# Patient Record
Sex: Male | Born: 1970 | Hispanic: Yes | Marital: Married | Smoking: Current some day smoker
Health system: Southern US, Community
[De-identification: ages and names within clinical notes are randomized; demographics above are authoritative.]

## PROBLEM LIST (undated history)

## (undated) DIAGNOSIS — E119 Type 2 diabetes mellitus without complications: Secondary | ICD-10-CM

## (undated) DIAGNOSIS — M1990 Unspecified osteoarthritis, unspecified site: Secondary | ICD-10-CM

## (undated) DIAGNOSIS — Z87898 Personal history of other specified conditions: Secondary | ICD-10-CM

## (undated) DIAGNOSIS — E66813 Obesity, class 3: Secondary | ICD-10-CM

## (undated) DIAGNOSIS — Z6841 Body Mass Index (BMI) 40.0 and over, adult: Secondary | ICD-10-CM

## (undated) DIAGNOSIS — G4730 Sleep apnea, unspecified: Secondary | ICD-10-CM

## (undated) DIAGNOSIS — I10 Essential (primary) hypertension: Secondary | ICD-10-CM

## (undated) HISTORY — PX: ANKLE SURGERY: SHX546

## (undated) HISTORY — DX: Personal history of other specified conditions: Z87.898

## (undated) HISTORY — DX: Essential (primary) hypertension: I10

## (undated) HISTORY — PX: HERNIA REPAIR: SHX51

---

## 2004-04-21 ENCOUNTER — Emergency Department: Payer: Self-pay | Admitting: Emergency Medicine

## 2004-05-04 ENCOUNTER — Emergency Department: Payer: Self-pay | Admitting: Emergency Medicine

## 2007-02-05 ENCOUNTER — Ambulatory Visit: Payer: Self-pay | Admitting: Podiatry

## 2007-04-17 ENCOUNTER — Encounter: Payer: Self-pay | Admitting: Unknown Physician Specialty

## 2007-05-07 ENCOUNTER — Encounter: Payer: Self-pay | Admitting: Unknown Physician Specialty

## 2007-06-06 ENCOUNTER — Encounter: Payer: Self-pay | Admitting: Unknown Physician Specialty

## 2007-07-07 ENCOUNTER — Encounter: Payer: Self-pay | Admitting: Unknown Physician Specialty

## 2007-08-06 ENCOUNTER — Encounter: Payer: Self-pay | Admitting: Unknown Physician Specialty

## 2007-11-27 ENCOUNTER — Ambulatory Visit: Payer: Self-pay | Admitting: Surgery

## 2007-12-04 ENCOUNTER — Ambulatory Visit: Payer: Self-pay | Admitting: Surgery

## 2010-11-27 ENCOUNTER — Ambulatory Visit: Payer: Self-pay | Admitting: Family Medicine

## 2010-11-27 IMAGING — CR DG KNEE COMPLETE 4+V*R*
1 series · 4 of 4 positions shown · non-contrast
Comparison: none

REASON FOR EXAM: rt knee pain  fax results to [PHONE_NUMBER]
COMMENTS:

PROCEDURE:     MDR - MDR KNEE RT COMPLETE W/OBLIQUES  - [DATE] [DATE]
RESULT:     Images of the right knee demonstrate no definite fracture,
dislocation or radiopaque foreign body.

[Series 1: view not recorded · 0.17mm/px · 4 of 4 slices shown]
[im 1/4]
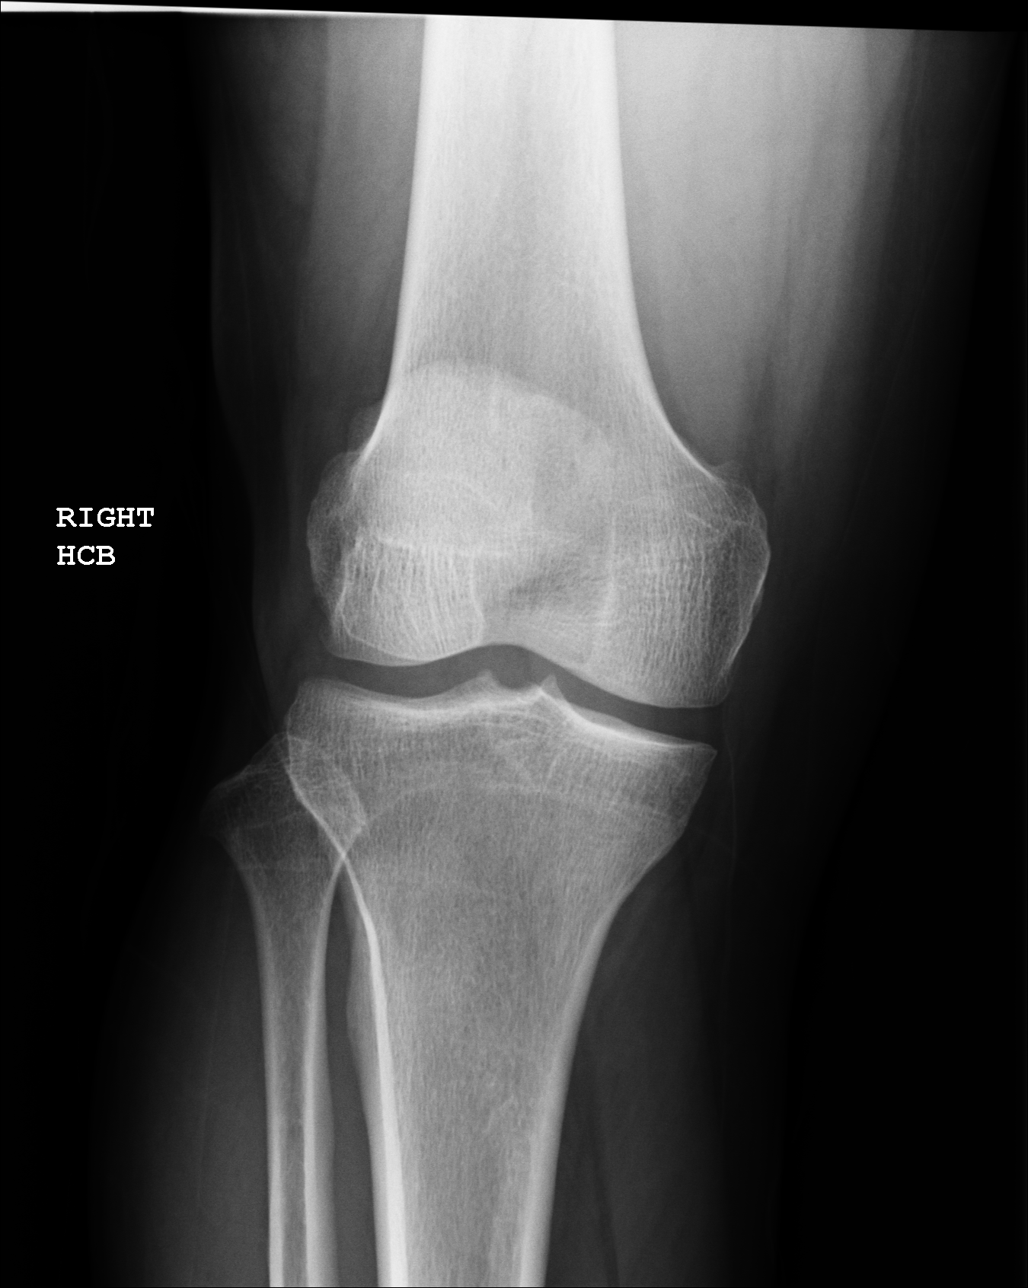
[im 2/4]
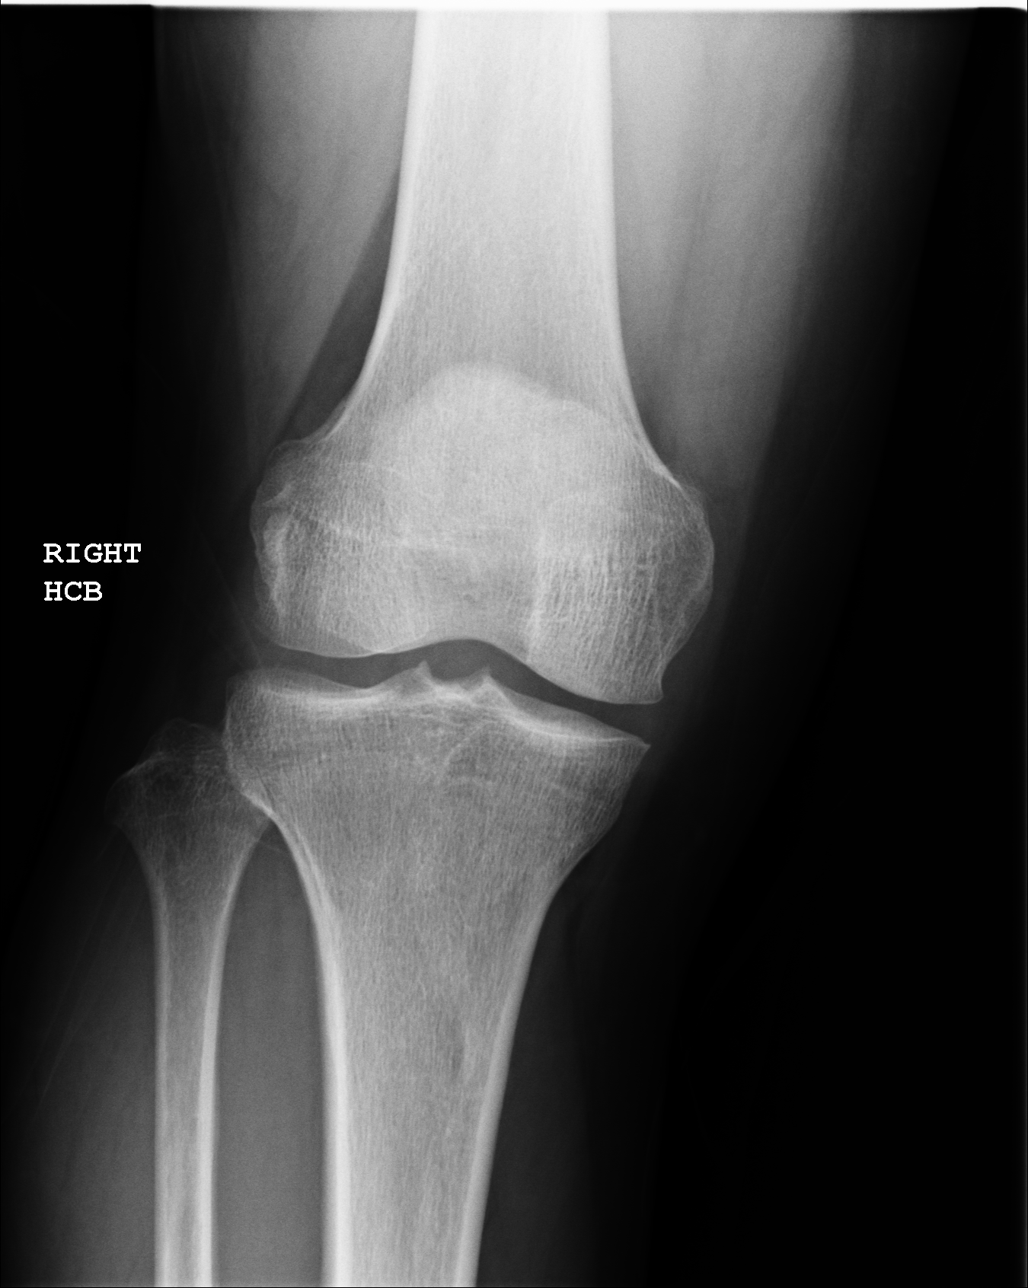
[im 3/4]
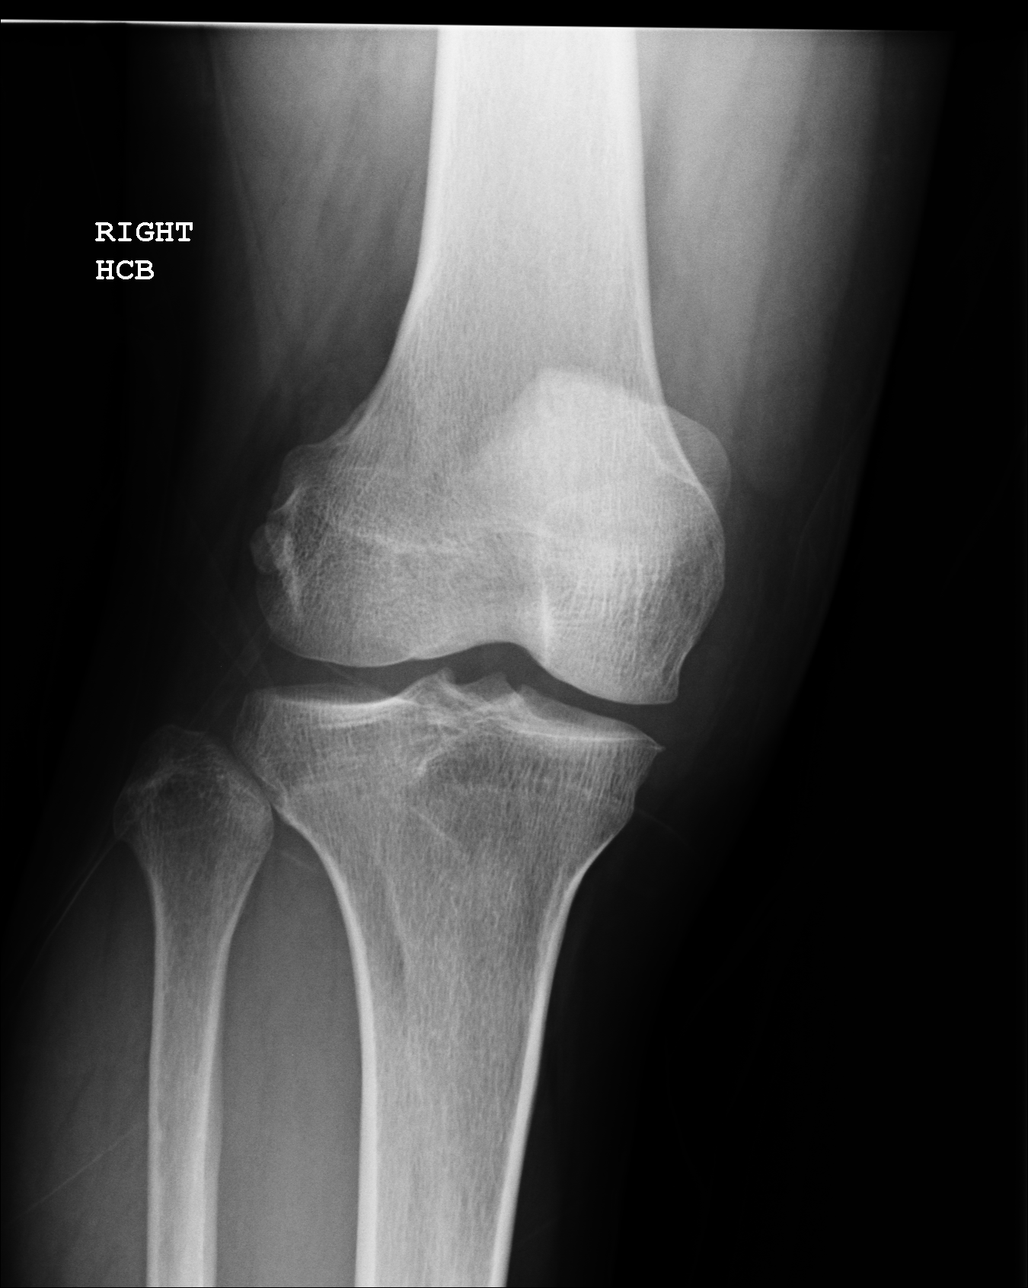
[im 4/4]
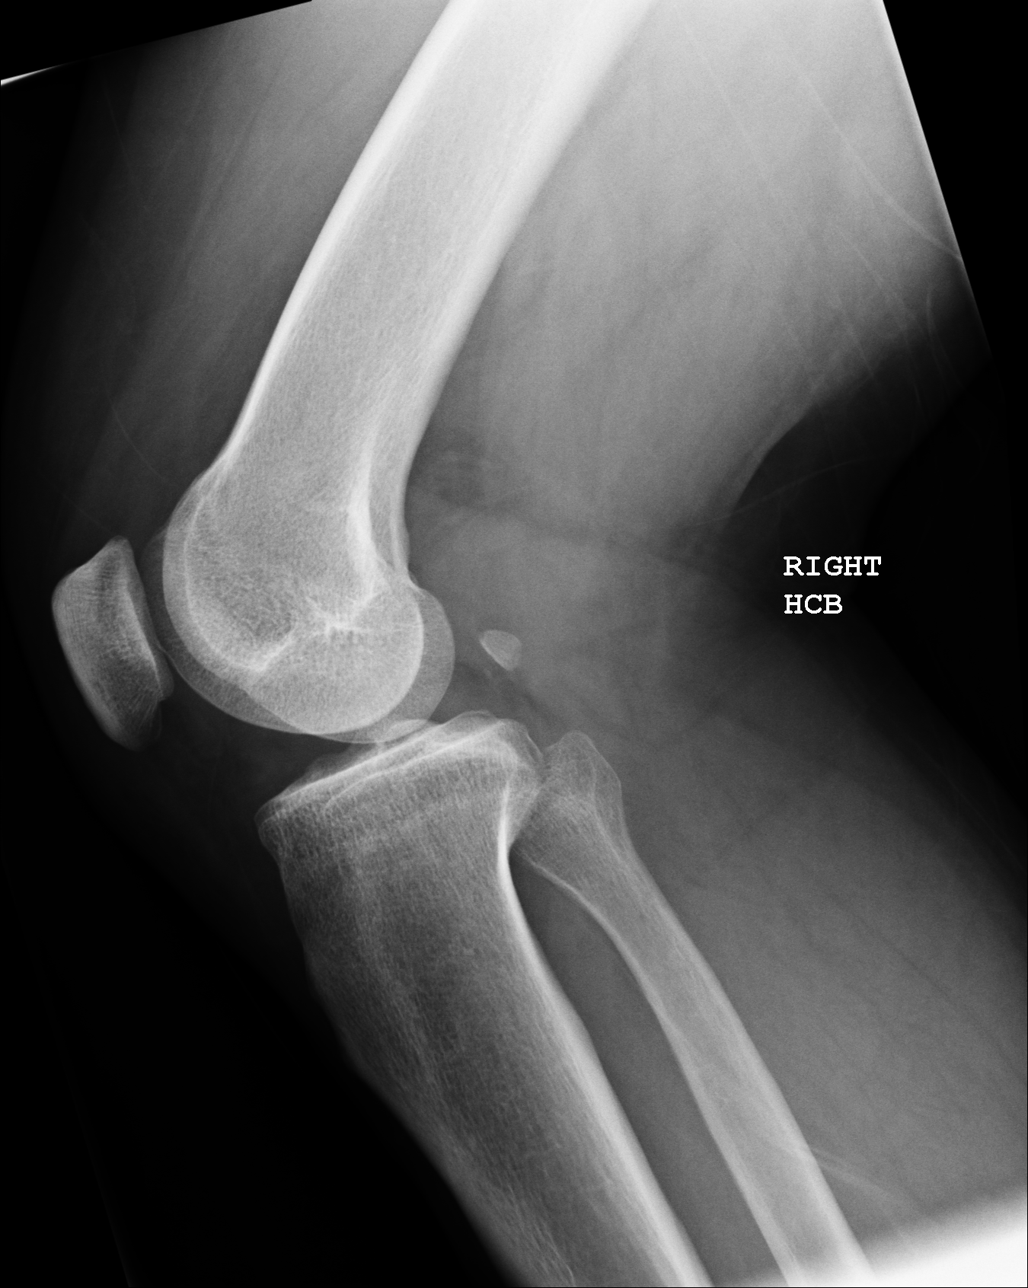

[4 of 4 positions shown; findings below may reference images not displayed]

IMPRESSION: Please see above.

## 2010-12-18 ENCOUNTER — Ambulatory Visit: Payer: Self-pay

## 2011-07-25 ENCOUNTER — Ambulatory Visit: Payer: Self-pay | Admitting: Anesthesiology

## 2011-07-25 LAB — BASIC METABOLIC PANEL
Anion Gap: 7 (ref 7–16)
Calcium, Total: 8.4 mg/dL — ABNORMAL LOW (ref 8.5–10.1)
Chloride: 107 mmol/L (ref 98–107)
Co2: 26 mmol/L (ref 21–32)
Creatinine: 0.63 mg/dL (ref 0.60–1.30)
EGFR (Non-African Amer.): >60
Glucose: 103 mg/dL — ABNORMAL HIGH (ref 65–99)
Osmolality: 279 (ref 275–301)
Potassium: 4 mmol/L (ref 3.5–5.1)

## 2011-07-27 ENCOUNTER — Ambulatory Visit: Payer: Self-pay | Admitting: Orthopedic Surgery

## 2014-03-24 DIAGNOSIS — E785 Hyperlipidemia, unspecified: Secondary | ICD-10-CM | POA: Insufficient documentation

## 2014-03-24 DIAGNOSIS — R7989 Other specified abnormal findings of blood chemistry: Secondary | ICD-10-CM | POA: Insufficient documentation

## 2014-05-30 NOTE — Op Note (Signed)
PATIENT NAME:  Carl Thornton, [name] MR#:  [id number] DATE OF BIRTH:  1970/07/16  DATE OF PROCEDURE:  07/27/2011  PREOPERATIVE DIAGNOSIS: Right knee medial meniscus tear.   POSTOPERATIVE DIAGNOSIS: Right knee medial meniscus tear.   PROCEDURE: Right knee partial medial meniscectomy.   SURGEON: Leitha SchullerMichael J. Won Kreuzer, MD  ANESTHESIA: General.    DESCRIPTION OF PROCEDURE: Patient was brought to the Operating Room and after adequate anesthesia was obtained, the leg was placed in the arthroscopic legholder with the leg prepped and draped in the usual sterile fashion. After patient identification and timeout procedures were completed an inferolateral portal was made and the arthroscope introduced. Initial inspection revealed essentially normal appearing patellofemoral joint. After coming to the medial compartment, an inferomedial portal was made and probe introduced. The articular cartilage showed some areas of fibrillation on both femoral and tibial condyles, approximately 25% of the cartilage. On probing there was an extensive complex tear at the junction of the middle and posterior thirds extending more into the middle third with involvement out to the outer area of the meniscus with both vertical and horizontal components that extended more posteriorly as well. The anterior cruciate ligament was intact and lateral compartment was normal. A meniscal punch and shaver were then used to debride most of the meniscal tear and an ArthroCare wand was then used to smooth off the residual. The knee was thoroughly irrigated until clear. The wound was infiltrated with 20 mL of 0.5% Sensorcaine with epinephrine for postoperative analgesia and covered with Xeroform, 4 x 4's, Webril, and Ace wrap.   ESTIMATED BLOOD LOSS: Minimal.   COMPLICATIONS: None.      SPECIMENS: None. Pre- and postprocedure pictures obtained.   ____________________________ Leitha SchullerMichael J. Taheem Fricke, MD mjm:cms D: 07/27/2011 22:50:19  ET T: 07/28/2011 10:30:35 ET JOB#: [id number]  cc: Leitha SchullerMichael J. Cochise Dinneen, MD, <Dictator> Leitha SchullerMICHAEL J Shantella Blubaugh MD ELECTRONICALLY SIGNED 07/29/2011 7:53

## 2016-09-19 ENCOUNTER — Encounter: Payer: Self-pay | Admitting: Nurse Practitioner

## 2016-09-19 ENCOUNTER — Ambulatory Visit (INDEPENDENT_AMBULATORY_CARE_PROVIDER_SITE_OTHER): Payer: Self-pay | Admitting: Nurse Practitioner

## 2016-09-19 ENCOUNTER — Other Ambulatory Visit: Payer: Self-pay

## 2016-09-19 VITALS — BP 107/55 | HR 70 | Temp 98.3°F | Ht 65.5 in | Wt 266.2 lb

## 2016-09-19 DIAGNOSIS — R7303 Prediabetes: Secondary | ICD-10-CM

## 2016-09-19 DIAGNOSIS — N521 Erectile dysfunction due to diseases classified elsewhere: Secondary | ICD-10-CM | POA: Diagnosis not present

## 2016-09-19 DIAGNOSIS — G629 Polyneuropathy, unspecified: Secondary | ICD-10-CM | POA: Diagnosis not present

## 2016-09-19 DIAGNOSIS — G4730 Sleep apnea, unspecified: Secondary | ICD-10-CM | POA: Insufficient documentation

## 2016-09-19 DIAGNOSIS — Z7689 Persons encountering health services in other specified circumstances: Secondary | ICD-10-CM

## 2016-09-19 DIAGNOSIS — R0683 Snoring: Secondary | ICD-10-CM | POA: Diagnosis not present

## 2016-09-19 DIAGNOSIS — R400 Somnolence: Secondary | ICD-10-CM | POA: Diagnosis not present

## 2016-09-19 LAB — POCT GLYCOSYLATED HEMOGLOBIN (HGB A1C): Hemoglobin A1C: 6.8

## 2016-09-19 MED ORDER — GABAPENTIN 100 MG PO CAPS: 200.0000 mg | ORAL_CAPSULE | Freq: Every day | ORAL | 0 refills | Status: DC

## 2016-09-19 NOTE — Assessment & Plan Note (Signed)
Pt w/ decreased libido and decreased erectile function.  Likely related to undiagnosed sleep apnea, morbid obesity, and diabetes.  Plan: 1. Work on lifestyle modification to improve other chronic conditions. 2. Discussed sildenafil as option for symptomatic treatment in future.  Defer at this time. 3. Follow up 3 months.

## 2016-09-19 NOTE — Assessment & Plan Note (Signed)
Pt w/ acute onset neuropathy w/ tingling and numbness of right hand only.  Pt w/o decreased ROM. Low suspicion for diabetic neuropathy r/t single extremity affected.  Possible cervical radiculopathy, but negative for radiating pain.  Likely radial and ulnar nerve impingement at level of wrist complicated by repetitive work activities.  Plan:  1. Treat with NSAIDs (acetaminophen and ibuprofen).  Discussed alternate dosing and max dosing. 2. Apply heat and/or ice to affected area. 3. May also apply a muscle rub with lidocaine after heat or ice. 4. Take gabapentin 100-200 mg at bedtime.  Cautioned drowsiness. 5. Recommend workplace safety evaluation.  Encouraged modification of wrist movements, resting hand when not at work. 6. Follow up as needed if no improvement.

## 2016-09-19 NOTE — Assessment & Plan Note (Signed)
Pt w/ morbid obesity, neck circumference 20.25 inches, STOP-BANG score: 6 which places pt at high risk for OSA.  He had been recommended to have sleep study by former PCP and has not had it completed.  Plan: 1. Sleep study order placed.  Discussed risks of untreated OSA. 2. Follow up as needed after test.

## 2016-09-19 NOTE — Patient Instructions (Addendum)
Carl Thornton, Thank you for coming in to clinic today.  1. For your tingling and numbness (neuropathy): - take gabapentin 100 mg once daily at bedtime.  Increase to two tablets at bedtime if no relief.  2. For your snoring: - I have requested a sleep center sleep study.  They will call you for this appointment.  3. For your weight: - Start with a food log to establish your baseline of what you are currently eating. - Cut back your serving sizes to eat less. - Weight loss goal is 1/2 to 1 lb per week.  Short term goal: 1 year = 25 lb weight loss. - Online resources: WrestlingReporter.dk and LimitLaws.com.cy (food logging and calorie counting)  Please schedule a follow-up appointment with [name], AGNP. Return in about 2 weeks (around 10/03/2016) for annual physical; in 3 months for diabetes; and as needed for your numbness and tingling.  If you have any other questions or concerns, please feel free to call the clinic or send a message through MyChart. You may also schedule an earlier appointment if necessary.  You will receive a survey after today's visit either digitally by e-mail or paper by [employer]. Your experiences and feedback matter to [place].  Please respond so we know how we are doing as we provide care for you.   [name], DNP, AGNP-BC Adult Gerontology Nurse Practitioner [facility], [facility]    Serving Sizes A serving size is a measured amount of food or drink, such as one slice of bread, that has an associated nutrient content. Knowing the serving size of a food or drink can help you determine how much of that food you should consume. What is the size of one serving? The size of one healthy serving depends on the food or drink. To determine a serving size, read the food label. If the food or drink does not have a food label, try to find serving size information online. Or, use the following to estimate the size of one adult serving: Grain 1 slice bread.  bagel.   cup pasta. Vegetable  cup cooked or canned vegetables. 1 cup raw, leafy greens. Fruit  cup canned fruit. 1 medium fruit.  cup dried fruit. Meat and Other Protein Sources 1 oz meat, poultry, or fish.  cup cooked beans. 1 egg.  cup nuts or seeds. 1 Tbsp nut butter.  cup tofu or tempeh. 2 Tbsp hummus. Dairy An individual container of yogurt (6-8 oz). 1 piece of cheese the size of your thumb (1 oz). 1 cup (8 oz) milk or milk alternative. Fat A piece the size of one dice. 1 tsp soft margarine. 1 Tbsp mayonnaise. 1 tsp vegetable oil. 1 Tbsp regular salad dressing. 2 Tbsp low-fat salad dressing. How many servings should I eat from each food group each day? The following are the suggested number of servings to try and have every day from each food group. You can also look at your eating throughout the week and aim for meeting these requirements on most days for overall healthy eating. Grain 6-8 servings. Try to have half of your grains from whole grains, such as whole wheat bread, corn tortillas, oatmeal, brown rice, whole wheat pasta, and bulgur. Vegetable At least 2-3 servings. Fruit 2 servings. Meat and Other Protein Foods 5-6 servings. Aim to have lean proteins, such as chicken, [place], fish, beans, or tofu. Dairy 3 servings. Choose low-fat or nonfat if you are trying to control your weight. Fat 2-3 servings. Is  a serving the same thing as a portion? No. A portion is the actual amount you eat, which may be more than one serving. Knowing the specific serving size of a food and the nutritional information that goes with it can help you make a healthy decision on what size portion to eat. What are some tips to help me learn healthy serving sizes?  Check food labels for serving sizes. Many foods that come as a single portion actually contain multiple servings.  Determine the serving size of foods you commonly eat and figure out how large a portion you usually eat.  Measure the  number of servings that can be held by the bowls, glasses, cups, and plates you typically use. For example, pour your breakfast cereal into your regular bowl and then pour it into a measuring cup.  For 1-2 days, measure the serving sizes of all the foods you eat.  Practice estimating serving sizes and determining how big your portions should be. This information is not intended to replace advice given to you by your health care provider. Make sure you discuss any questions you have with your health care provider. Document Released: 10/21/2002 Document Revised: 09/17/2015 Document Reviewed: 04/21/2013 Elsevier Interactive Patient Education  [employer]2018 Elsevier Inc.

## 2016-09-19 NOTE — Assessment & Plan Note (Signed)
Pt previously tested and deemed pre-diabetic.  Poor adherence to lifestyle changes.  No recent evaluation.  Plan: 1. POCT A1c today indicates progression to diabetes. 2. Reviewed lifestyle changes.   3. Discussed starting metformin.  Will hold medication until 3 months to confirm diabetes diagnosis.   4. Follow up 3 months.

## 2016-09-19 NOTE — Progress Notes (Signed)
I have reviewed this encounter including the documentation in this note and/or discussed this patient with the provider, Lauren Kennedy, AGPCNP-BC. I am certifying that I agree with the content of this note as supervising physician.  Devario Bucklew, DO [facility] 09/19/2016, 1:26 PM 

## 2016-09-19 NOTE — Progress Notes (Signed)
Subjective:    Patient ID: Carl Thornton, male    DOB: 04/01/1970, 46 y.o.   MRN: [id number]  Talmadge Jackelyn Poling Derrell Lolling is a 46 y.o. male presenting on 09/19/2016 for Establish Care (numbness,tingling in the fingers on right hand x 2 weeks )   HPI Establish Care New Provider Pt last seen by PCP 2 years ago.  Obtain records from [facility].   Neuropathy Fingers are numb and tingling.  Some small amount of pain before the numbness.  Started 2 weeks ago.  Feels when he goes to bed and is constant throughout the day. Chopping daily, right hand dominant. - Denies neck, shoulder, arm pain.  Prior wrist pain, but no current pain. Has not taken any medications for current symptoms.  - History of prediabetes.  Has added exercise and improved eating in past, but hasn't continued these lifestyle changes.  Snoring Falls asleep when he is still and quiet.   Observed snoring w/ apneas at night.   Daytime sleepiness.   Occasionally feels he is choking when he wakes up - only one known occurrence.   Denies frequent headaches.  STOP-Bang OSA scoring Snoring yes 1  Tiredness yes 1  Observed apneas yes 1  Pressure HTN no 0  BMI > 35 kg/m2 yes 1  Age > 50  no 0  Neck (male >17 in = 20.25 inches yes 1   Gender male yes 1  OSA risk low (0-2)  OSA risk intermediate (3-4)  OSA risk high (5+)  Total: 6    Erectile Dysfunction   Pt notes is having more difficulty obtaining an erection and decreased libido.  Notes is not impossible to obtain erection, but significantly more difficult than in past.  Would like to be sexually active more frequently. Asks about possible causes of this at age 57 and states he shouldn't be having this problem "at my age."  Past Medical History:  Diagnosis Date  . History of prediabetes   . Hypertension    not on medication   Past Surgical History:  Procedure Laterality Date  . ANKLE SURGERY    . HERNIA REPAIR     umbilical    Social History   Social History  . Marital status: Married    Spouse name: N/A  . Number of children: N/A  . Years of education: N/A   Occupational History  . Not on file.   Social History Main Topics  . Smoking status: Current Some Day Smoker    Types: Cigarettes  . Smokeless tobacco: Never Used     Comment: smokes 1 pack per week  . Alcohol use Yes     Comment: 12+ beverages   . Drug use: No  . Sexual activity: Yes   Other Topics Concern  . Not on file   Social History Narrative  . No narrative on file   Family History  Problem Relation Age of Onset  . Diabetes Mother   . Asthma Father   . Diabetes Sister   . Heart attack Neg Hx   . Stroke Neg Hx   . Cancer Neg Hx    No current outpatient prescriptions on file prior to visit.   No current facility-administered medications on file prior to visit.     Review of Systems  Constitutional: Positive for fatigue.  HENT: Negative.   Eyes: Negative.   Respiratory: Negative.   Cardiovascular: Negative.   Gastrointestinal: Negative.   Endocrine: Negative.   Genitourinary:  Decreased libido, difficulty obtaining erection  Skin: Positive for color change.       Toenail discoloration  Allergic/Immunologic: Negative.   Neurological: Positive for numbness.  Hematological: Negative.   Psychiatric/Behavioral: Negative.    Per HPI unless specifically indicated above      Objective:    BP (!) 107/55 (BP Location: Right Arm, Patient Position: Sitting, Cuff Size: Large)   Pulse 70   Temp 98.3 F (36.8 C) (Oral)   Ht 5' 5.5" (1.664 m)   Wt 266 lb 3.2 oz (120.7 kg)   BMI 43.62 kg/m   Wt Readings from Last 3 Encounters:  09/19/16 266 lb 3.2 oz (120.7 kg)     Physical Exam  General - morbidly obese, well-appearing, NAD HEENT - Normocephalic, atraumatic Neck - supple, non-tender, no LAD, no thyromegaly, no carotid bruit Heart - RRR, no murmurs heard Lungs - Clear throughout all lobes, no wheezing,  crackles, or rhonchi. Normal work of breathing. Extremeties - non-tender, no edema, cap refill < 2 seconds, peripheral pulses intact +2 bilaterally Musculoskeletal - Full AROM neck, shoulders, elbows, wrists hands bilaterally, negative Phalen and Tinel tests. Skin - warm, dry Neuro - awake, alert, oriented x3, normal gait Psych - Normal mood and affect, normal behavior    Results for orders placed or performed in visit on 09/19/16  POCT HgB A1C  Result Value Ref Range   Hemoglobin A1C 6.8       Assessment & Plan:   Problem List Items Addressed This Visit      Respiratory   Observed sleep apnea    Pt w/ morbid obesity, neck circumference 20.25 inches, STOP-BANG score: 6 which places pt at high risk for OSA.  He had been recommended to have sleep study by former PCP and has not had it completed.  Plan: 1. Sleep study order placed.  Discussed risks of untreated OSA. 2. Follow up as needed after test.      Relevant Orders   Nocturnal polysomnography (NPSG)     Nervous and Auditory   Neuropathy    Pt w/ acute onset neuropathy w/ tingling and numbness of right hand only.  Pt w/o decreased ROM. Low suspicion for diabetic neuropathy r/t single extremity affected.  Possible cervical radiculopathy, but negative for radiating pain.  Likely radial and ulnar nerve impingement at level of wrist complicated by repetitive work activities.  Plan:  1. Treat with NSAIDs (acetaminophen and ibuprofen).  Discussed alternate dosing and max dosing. 2. Apply heat and/or ice to affected area. 3. May also apply a muscle rub with lidocaine after heat or ice. 4. Take gabapentin 100-200 mg at bedtime.  Cautioned drowsiness. 5. Recommend workplace safety evaluation.  Encouraged modification of wrist movements, resting hand when not at work. 6. Follow up as needed if no improvement.       Relevant Medications   gabapentin (NEURONTIN) 100 MG capsule   Other Relevant Orders   POCT HgB A1C (Completed)      Other   Prediabetes - Primary    Pt previously tested and deemed pre-diabetic.  Poor adherence to lifestyle changes.  No recent evaluation.  Plan: 1. POCT A1c today indicates progression to diabetes. 2. Reviewed lifestyle changes.   3. Discussed starting metformin.  Will hold medication until 3 months to confirm diabetes diagnosis.   4. Follow up 3 months.       Relevant Orders   POCT HgB A1C (Completed)   Erectile dysfunction due to diseases classified elsewhere  Pt w/ decreased libido and decreased erectile function.  Likely related to undiagnosed sleep apnea, morbid obesity, and diabetes.  Plan: 1. Work on lifestyle modification to improve other chronic conditions. 2. Discussed sildenafil as option for symptomatic treatment in future.  Defer at this time. 3. Follow up 3 months.       Other Visit Diagnoses    Daytime sleepiness       See A/P for Observed sleep apnea   Relevant Orders   Nocturnal polysomnography (NPSG)   Snoring       See A/P for Observed sleep apnea   Relevant Orders   Nocturnal polysomnography (NPSG)   Encounter to establish care     Previous PCP was at [facility].  Results reviewed in Care Everywhere.  Past medical, family, and surgical history reviewed.       Meds ordered this encounter  Medications  . DISCONTD: gabapentin (NEURONTIN) 100 MG capsule    Sig: Take 2 capsules (200 mg total) by mouth at bedtime.    Dispense:  60 capsule    Refill:  0    Order Specific Question:   Supervising Provider    Answer:   Smitty Cords [[id number]]  . gabapentin (NEURONTIN) 100 MG capsule    Sig: Take 2 capsules (200 mg total) by mouth at bedtime.    Dispense:  60 capsule    Refill:  0      Follow up plan: Return in about 2 weeks (around 10/03/2016) for annual physical; in 3 months for diabetes; and as needed for your numbness and tingling.  [name], DNP, AGPCNP-BC Adult Gerontology Primary Care Nurse Practitioner [facility] 09/19/2016, 12:50 PM

## 2016-10-03 ENCOUNTER — Ambulatory Visit (INDEPENDENT_AMBULATORY_CARE_PROVIDER_SITE_OTHER): Payer: Managed Care, Other (non HMO) | Admitting: Nurse Practitioner

## 2016-10-03 ENCOUNTER — Encounter: Payer: Self-pay | Admitting: Nurse Practitioner

## 2016-10-03 ENCOUNTER — Telehealth: Payer: Self-pay

## 2016-10-03 VITALS — BP 116/61 | HR 73 | Temp 97.7°F | Ht 65.5 in | Wt 262.4 lb

## 2016-10-03 DIAGNOSIS — E782 Mixed hyperlipidemia: Secondary | ICD-10-CM | POA: Diagnosis not present

## 2016-10-03 DIAGNOSIS — Z0000 Encounter for general adult medical examination without abnormal findings: Secondary | ICD-10-CM | POA: Diagnosis not present

## 2016-10-03 DIAGNOSIS — Z125 Encounter for screening for malignant neoplasm of prostate: Secondary | ICD-10-CM

## 2016-10-03 DIAGNOSIS — Z23 Encounter for immunization: Secondary | ICD-10-CM | POA: Diagnosis not present

## 2016-10-03 DIAGNOSIS — Z1211 Encounter for screening for malignant neoplasm of colon: Secondary | ICD-10-CM | POA: Diagnosis not present

## 2016-10-03 DIAGNOSIS — G629 Polyneuropathy, unspecified: Secondary | ICD-10-CM | POA: Diagnosis not present

## 2016-10-03 LAB — CBC WITH DIFFERENTIAL/PLATELET
Basophils Absolute: 46 cells/uL (ref 0–200)
Basophils Relative: 1 %
Eosinophils Absolute: 184 cells/uL (ref 15–500)
Eosinophils Relative: 4 %
HCT: 48 % (ref 38.5–50.0)
Hemoglobin: 16 g/dL (ref 13.2–17.1)
Lymphocytes Relative: 45 %
Lymphs Abs: 2070 cells/uL (ref 850–3900)
MCH: 30.5 pg (ref 27.0–33.0)
MCHC: 33.3 g/dL (ref 32.0–36.0)
MCV: 91.6 fL (ref 80.0–100.0)
MPV: 9.5 fL (ref 7.5–12.5)
Monocytes Absolute: 230 cells/uL (ref 200–950)
Monocytes Relative: 5 %
Neutro Abs: 2070 cells/uL (ref 1500–7800)
Neutrophils Relative %: 45 %
Platelets: 283 10*3/uL (ref 140–400)
RBC: 5.24 MIL/uL (ref 4.20–5.80)
RDW: 14.6 % (ref 11.0–15.0)
WBC: 4.6 10*3/uL (ref 3.8–10.8)

## 2016-10-03 MED ORDER — GABAPENTIN 300 MG PO CAPS: 300.0000 mg | ORAL_CAPSULE | Freq: Every day | ORAL | 5 refills | Status: DC

## 2016-10-03 NOTE — Telephone Encounter (Signed)
I called [facility] and left a detail message on their vm to find out the status of the sleep study referral.

## 2016-10-03 NOTE — Patient Instructions (Addendum)
Carl Thornton, Thank you for coming in to clinic today.  1. For your screenings: - labs today. - Colonoscopy: Gastroenterology will call you to make an appointment.     - TETANUS and FLU shots were given today.   Please schedule a follow-up appointment with [name], AGNP. Return in about 6 months (around 04/04/2017) for Prediabetes and in 1 year for annual physical.  If you have any other questions or concerns, please feel free to call the clinic or send a message through MyChart. You may also schedule an earlier appointment if necessary.  You will receive a survey after today's visit either digitally by e-mail or paper by [employer]. Your experiences and feedback matter to [place].  Please respond so we know how we are doing as we provide care for you.   [name], DNP, AGNP-BC Adult Gerontology Nurse Practitioner [facility], [facility]   Colonoscopy, Adult A colonoscopy is an exam to look at the entire large intestine. During the exam, a lubricated, bendable tube is inserted into the anus and then passed into the rectum, colon, and other parts of the large intestine. A colonoscopy is often done as a part of normal colorectal screening or in response to certain symptoms, such as anemia, persistent diarrhea, abdominal pain, and blood in the stool. The exam can help screen for and diagnose medical problems, including:  Tumors.  Polyps.  Inflammation.  Areas of bleeding.  Tell a health care provider about:  Any allergies you have.  All medicines you are taking, including vitamins, herbs, eye drops, creams, and over-the-counter medicines.  Any problems you or family members have had with anesthetic medicines.  Any blood disorders you have.  Any surgeries you have had.  Any medical conditions you have.  Any problems you have had passing stool. What are the risks? Generally, this is a safe procedure. However, problems may occur, including:  Bleeding.  A  tear in the intestine.  A reaction to medicines given during the exam.  Infection (rare).  What happens before the procedure? Eating and drinking restrictions Follow instructions from your health care provider about eating and drinking, which may include:  A few days before the procedure - follow a low-fiber diet. Avoid nuts, seeds, dried fruit, raw fruits, and vegetables.  1-3 days before the procedure - follow a clear liquid diet. Drink only clear liquids, such as clear broth or bouillon, black coffee or tea, clear juice, clear soft drinks or sports drinks, gelatin dessert, and popsicles. Avoid any liquids that contain red or purple dye.  On the day of the procedure - do not eat or drink anything during the 2 hours before the procedure, or within the time period that your health care provider recommends.  Bowel prep If you were prescribed an oral bowel prep to clean out your colon:  Take it as told by your health care provider. Starting the day before your procedure, you will need to drink a large amount of medicated liquid. The liquid will cause you to have multiple loose stools until your stool is almost clear or light green.  If your skin or anus gets irritated from diarrhea, you may use these to relieve the irritation: ? Medicated wipes, such as adult wet wipes with aloe and vitamin E. ? A skin soothing-product like petroleum jelly.  If you vomit while drinking the bowel prep, take a break for up to 60 minutes and then begin the bowel prep again. If vomiting continues and you  cannot take the bowel prep without vomiting, call your health care provider.  General instructions  Ask your health care provider about changing or stopping your regular medicines. This is especially important if you are taking diabetes medicines or blood thinners.  Plan to have someone take you home from the hospital or clinic. What happens during the procedure?  An IV tube may be inserted into one of  your veins.  You will be given medicine to help you relax (sedative).  To reduce your risk of infection: ? Your health care team will wash or sanitize their hands. ? Your anal area will be washed with soap.  You will be asked to lie on your side with your knees bent.  Your health care provider will lubricate a long, thin, flexible tube. The tube will have a camera and a light on the end.  The tube will be inserted into your anus.  The tube will be gently eased through your rectum and colon.  Air will be delivered into your colon to keep it open. You may feel some pressure or cramping.  The camera will be used to take images during the procedure.  A small tissue sample may be removed from your body to be examined under a microscope (biopsy). If any potential problems are found, the tissue will be sent to a lab for testing.  If small polyps are found, your health care provider may remove them and have them checked for cancer cells.  The tube that was inserted into your anus will be slowly removed. The procedure may vary among health care providers and hospitals. What happens after the procedure?  Your blood pressure, heart rate, breathing rate, and blood oxygen level will be monitored until the medicines you were given have worn off.  Do not drive for 24 hours after the exam.  You may have a small amount of blood in your stool.  You may pass gas and have mild abdominal cramping or bloating due to the air that was used to inflate your colon during the exam.  It is up to you to get the results of your procedure. Ask your health care provider, or the department performing the procedure, when your results will be ready. This information is not intended to replace advice given to you by your health care provider. Make sure you discuss any questions you have with your health care provider. Document Released: 01/20/2000 Document Revised: 11/23/2015 Document Reviewed: 04/05/2015 Elsevier  Interactive Patient Education  2018 [employer].

## 2016-10-03 NOTE — Assessment & Plan Note (Signed)
Over last 2 weeks, pt on gabapentin and noting persistent neuropathy w/ tingling and numbness of right hand only.  Pt w/o decreased ROM.   Plan:  1. INCREASE gabapentin 300 mg at bedtime.  Cautioned drowsiness. 2. Follow up 2-4 more weeks if no improvement for possible ortho vs neurology evaluation.

## 2016-10-03 NOTE — Progress Notes (Signed)
Subjective:    Patient ID: Carl Thornton, male    DOB: 06-01-1970, 46 y.o.   MRN: [id number]  Carl Thornton is a 46 y.o. male presenting on 10/03/2016 for Annual Exam   HPI  Needs follow up for sleep study - has not been contacted yet.  Gabapentin not improving neuropathy in left hand.  Annual Physical Exam Patient has been feeling well.  They have no acute concerns today. Sleeps 11p-5a  hours per night interrupted x 1 for restroom. and 1 hr nap in afternoon regularly  HEALTH MAINTENANCE:  Weight/BMI: lost 4 lbs in 2 weeks has weight loss goal for 6 months of about 20 lbs. Physical activity: exercising 30 minutes Diet: drinking water, reducing fried foods/some breads Seatbelt: always Sunscreen: never Prostate exam/PSA: Prefers PSA today Colon cancer screening:  Prefers colonoscopy  Per ACS guidelines starting at age 46.  VACCINES: Tetanus: Last tetanus 1990 Influenza: administer today  Past Medical History:  Diagnosis Date  . History of prediabetes   . Hypertension    not on medication   Past Surgical History:  Procedure Laterality Date  . ANKLE SURGERY    . HERNIA REPAIR     umbilical   Social History   Social History  . Marital status: Married    Spouse name: N/A  . Number of children: N/A  . Years of education: N/A   Occupational History  . Not on file.   Social History Main Topics  . Smoking status: Current Some Day Smoker    Types: Cigarettes  . Smokeless tobacco: Never Used     Comment: smokes 1 pack per week  . Alcohol use Yes     Comment: 12+ beverages   . Drug use: No  . Sexual activity: Yes   Other Topics Concern  . Not on file   Social History Narrative  . No narrative on file   Family History  Problem Relation Age of Onset  . Diabetes Mother   . Asthma Father   . Diabetes Sister   . Heart attack Neg Hx   . Stroke Neg Hx   . Cancer Neg Hx    Current Outpatient Prescriptions on File Prior to Visit    Medication Sig  . gabapentin (NEURONTIN) 100 MG capsule Take 2 capsules (200 mg total) by mouth at bedtime.   No current facility-administered medications on file prior to visit.     Review of Systems  Constitutional: Negative.   HENT: Negative.   Eyes: Negative.   Respiratory: Negative.   Cardiovascular: Negative.   Gastrointestinal: Negative.   Endocrine: Negative.   Genitourinary: Negative.   Musculoskeletal: Negative.   Skin: Negative.   Allergic/Immunologic: Negative.   Neurological: Negative.   Hematological: Negative.   Psychiatric/Behavioral: Negative.    Per HPI unless specifically indicated above     Objective:    BP 116/61 (BP Location: Right Arm, Patient Position: Sitting, Cuff Size: Large)   Pulse 73   Temp 97.7 F (36.5 C) (Oral)   Ht 5' 5.5" (1.664 m)   Wt 262 lb 6.4 oz (119 kg)   SpO2 98%   BMI 43.00 kg/m   Wt Readings from Last 3 Encounters:  10/03/16 262 lb 6.4 oz (119 kg)  09/19/16 266 lb 3.2 oz (120.7 kg)    Physical Exam  General - healthy, well-appearing, NAD HEENT - Normocephalic, atraumatic, PERRL, EOMI, patent nares w/o congestion, oropharynx clear, MMM Neck - supple, non-tender, no LAD, no thyromegaly, no carotid bruit  Heart - RRR, no murmurs heard Lungs - Clear throughout all lobes, no wheezing, crackles, or rhonchi. Normal work of breathing. Abdomen - soft, NTND, no masses, no hepatosplenomegaly, active bowel sounds GU - Genital exam deferred.  Chaperoned deferred.  Pt's wife present in room. Rectal/DRE: Normal external exam without hemorrhoids fissures or abnormality. DRE with palpation of non-enlarged prostate smooth symmetrical without nodule or tenderness. Extremeties - non-tender, no edema, cap refill < 2 seconds, peripheral pulses intact +2 bilaterally Skin - warm, dry, no rashes Neuro - awake, alert, oriented x3, CN II-X intact, intact muscle strength 5/5 bilaterally, intact distal sensation to light touch, normal coordination,  normal gait Psych - Normal mood and affect, normal behavior   Results for orders placed or performed in visit on 09/19/16  POCT HgB A1C  Result Value Ref Range   Hemoglobin A1C 6.8       Assessment & Plan:   Problem List Items Addressed This Visit      Nervous and Auditory   Neuropathy    Over last 2 weeks, pt on gabapentin and noting persistent neuropathy w/ tingling and numbness of right hand only.  Pt w/o decreased ROM.   Plan:  1. INCREASE gabapentin 300 mg at bedtime.  Cautioned drowsiness. 2. Follow up 2-4 more weeks if no improvement for possible ortho vs neurology evaluation.      Relevant Medications   gabapentin (NEURONTIN) 300 MG capsule    Other Visit Diagnoses    Encounter for annual physical exam    -  Primary Physical exam with no new findings.  Well adult with no acute concerns.  Plan: 1. Obtain health maintenance screenings. 2. Return 1 year for annual physical.   Relevant Orders   Lipid panel   PSA, total and free   Flu Vaccine QUAD 6+ mos PF IM (Fluarix Quad PF) (Completed)   Tdap vaccine greater than or equal to 7yo IM (Completed)   CBC with Differential   TSH   Comprehensive metabolic panel   Prostate cancer screening     Pt > 68 years of age.  Needs discussion to determine whether to proceed with prostate cancer screening.  Plan: 1. Mutual decision making discussion for options of PSA testing vs DRE or both should be performed.  Discussed false positive findings w/ PSA and need for biopsy if PSA elevated.  Pt prefers screening by both DRE and PSA. 2. PSA ordered.  DRE performed. 3. Follow up 1 year for repeat exam.   Relevant Orders   PSA, total and free   Encounter for vaccination       Relevant Orders   Flu Vaccine QUAD 6+ mos PF IM (Fluarix Quad PF) (Completed)   Tdap vaccine greater than or equal to 7yo IM (Completed)      Problem List Items Addressed This Visit      Nervous and Auditory   Neuropathy    Over last 2 weeks, pt on  gabapentin and noting persistent neuropathy w/ tingling and numbness of right hand only.  Pt w/o decreased ROM.   Plan:  1. INCREASE gabapentin 300 mg at bedtime.  Cautioned drowsiness. 2. Follow up 2-4 more weeks if no improvement for possible ortho vs neurology evaluation.        Relevant Medications   gabapentin (NEURONTIN) 300 MG capsule     Colon cancer screening     Pt requiring colon cancer screening w/o prior screening.  No family history of colon cancer, but pt prefers earlier  screening per ACS guidelines.  Plan: - Discussed timing for initiation of colon cancer screening ACS vs USPSTF guidelines - Mutual decision making discussion for options of colonoscopy vs cologuard.  Pt prefers colonoscopy.  - Referral to GI placed.   Relevant Orders   Ambulatory referral to Gastroenterology      Meds ordered this encounter  Medications  . gabapentin (NEURONTIN) 300 MG capsule    Sig: Take 1 capsule (300 mg total) by mouth at bedtime.    Dispense:  30 capsule    Refill:  5    Order Specific Question:   Supervising Provider    Answer:   Smitty Cords [[id number]]    Follow up plan: Return in about 6 months (around 04/04/2017) for Prediabetes and in 1 year for annual physical.  [name], DNP, AGPCNP-BC Adult Gerontology Primary Care Nurse Practitioner [facility] 10/03/2016, 9:00 AM

## 2016-10-03 NOTE — Telephone Encounter (Signed)
-----   Message from Galen ManilaLauren Renee Kennedy, NP sent at 10/03/2016 12:32 PM EDT ----- Regarding: Follow up on polysomnography order? Pt has not yet been notified about his sleep study.  Can you follow up?  I did complete a form for his insurance regarding risk factors, so may still be awaiting PA.

## 2016-10-03 NOTE — Progress Notes (Signed)
I have reviewed this encounter including the documentation in this note and/or discussed this patient with the provider, Wilhelmina McardleLauren Kennedy, AGPCNP-BC. I am certifying that I agree with the content of this note as supervising physician.  Saralyn PilarAlexander Jaculin Rasmus, DO [facility] 10/03/2016, 5:10 PM

## 2016-10-04 LAB — COMPREHENSIVE METABOLIC PANEL
ALT: 66 U/L — ABNORMAL HIGH (ref 9–46)
AST: 41 U/L — ABNORMAL HIGH (ref 10–40)
Albumin: 4.3 g/dL (ref 3.6–5.1)
Alkaline Phosphatase: 106 U/L (ref 40–115)
BUN: 16 mg/dL (ref 7–25)
CO2: 18 mmol/L — ABNORMAL LOW (ref 20–32)
Calcium: 9 mg/dL (ref 8.6–10.3)
Chloride: 106 mmol/L (ref 98–110)
Creat: 0.7 mg/dL (ref 0.60–1.35)
Glucose, Bld: 97 mg/dL (ref 65–99)
Potassium: 4.2 mmol/L (ref 3.5–5.3)
Sodium: 139 mmol/L (ref 135–146)
Total Bilirubin: 0.8 mg/dL (ref 0.2–1.2)
Total Protein: 7.3 g/dL (ref 6.1–8.1)

## 2016-10-04 LAB — LIPID PANEL
Cholesterol: 225 mg/dL — ABNORMAL HIGH (ref <200)
HDL: 27 mg/dL — ABNORMAL LOW (ref >40)
LDL Cholesterol: 165 mg/dL — ABNORMAL HIGH (ref <100)
Total CHOL/HDL Ratio: 8.3 Ratio — ABNORMAL HIGH (ref <5.0)
Triglycerides: 164 mg/dL — ABNORMAL HIGH (ref <150)
VLDL: 33 mg/dL — ABNORMAL HIGH (ref <30)

## 2016-10-04 LAB — PSA, TOTAL AND FREE
PSA, % Free: 33 % (ref >25)
PSA, Free: 0.1 ng/mL
PSA, Total: 0.3 ng/mL (ref <=4.0)

## 2016-10-04 LAB — TSH: TSH: 1.22 mIU/L (ref 0.40–4.50)

## 2016-10-05 MED ORDER — SIMVASTATIN 20 MG PO TABS: 20.0000 mg | ORAL_TABLET | Freq: Every day | ORAL | 5 refills | Status: DC

## 2016-10-05 NOTE — Addendum Note (Signed)
Addended by: Vernard GamblesKENNEDY, Coden Franchi R on: 10/05/2016 09:57 AM   Modules accepted: Orders

## 2016-10-09 ENCOUNTER — Other Ambulatory Visit: Payer: Self-pay

## 2016-10-09 DIAGNOSIS — E782 Mixed hyperlipidemia: Secondary | ICD-10-CM

## 2016-10-09 MED ORDER — SIMVASTATIN 20 MG PO TABS: 20.0000 mg | ORAL_TABLET | Freq: Every day | ORAL | 5 refills | Status: DC

## 2016-10-25 ENCOUNTER — Other Ambulatory Visit: Payer: Self-pay

## 2016-11-07 ENCOUNTER — Telehealth: Payer: Self-pay

## 2016-11-07 DIAGNOSIS — G629 Polyneuropathy, unspecified: Secondary | ICD-10-CM

## 2016-11-07 NOTE — Telephone Encounter (Signed)
The pt came by the office today complaining that the Gabapentin is not helping with his Neuropathy. He said he spoke w/ about it and you said you will switch the medication. Please advise

## 2016-11-08 ENCOUNTER — Other Ambulatory Visit: Payer: Self-pay

## 2016-11-08 MED ORDER — GABAPENTIN 400 MG PO CAPS: 400.0000 mg | ORAL_CAPSULE | Freq: Every day | ORAL | 1 refills | Status: DC

## 2016-11-08 NOTE — Addendum Note (Signed)
Addended by: Vernard Gambles on: 11/08/2016 09:49 AM   Modules accepted: Orders

## 2016-11-08 NOTE — Telephone Encounter (Signed)
I had previously discussed orthopedic referral with the patient. If he would like to see a specialist, I am more than willing to make that referral.  At this point, he may need treatment by them for a possible anatomical abnormality causing his nerve pain.  - He could start with PT evaluation and treatment as well if preferred.  For now, can increase gabapentin to 400 mg once at night.

## 2016-11-09 NOTE — Telephone Encounter (Signed)
Attempted to contact the pt. Several times, no answer. LMOM to return my call.

## 2016-12-19 ENCOUNTER — Other Ambulatory Visit: Payer: Self-pay

## 2016-12-19 ENCOUNTER — Ambulatory Visit (INDEPENDENT_AMBULATORY_CARE_PROVIDER_SITE_OTHER): Payer: Managed Care, Other (non HMO) | Admitting: Nurse Practitioner

## 2016-12-19 ENCOUNTER — Encounter: Payer: Self-pay | Admitting: Nurse Practitioner

## 2016-12-19 VITALS — BP 114/69 | Temp 97.8°F | Ht 65.5 in | Wt 262.0 lb

## 2016-12-19 DIAGNOSIS — G629 Polyneuropathy, unspecified: Secondary | ICD-10-CM

## 2016-12-19 DIAGNOSIS — R202 Paresthesia of skin: Secondary | ICD-10-CM | POA: Diagnosis not present

## 2016-12-19 DIAGNOSIS — R200 Anesthesia of skin: Secondary | ICD-10-CM

## 2016-12-19 MED ORDER — PREDNISONE 20 MG PO TABS: ORAL_TABLET | ORAL | 0 refills | Status: DC

## 2016-12-19 NOTE — Progress Notes (Signed)
Subjective:    Patient ID: Carl Thornton, male    DOB: [birth year], 47 y.o.   MRN: [id number]  Carl Thornton is a 46 y.o. male presenting on 12/19/2016 for Peripheral Neuropathy (no improvement with the numbness and tingling )   HPI Perpipheral Neuropathy Pt reports continued tingling and numbness of hand bilaterally, but significantly worse on R hand with pain from elbow to fingers on right arm.  Pt has had no improvement w/ gabapentin at increased doses.  Continues to have concern that something may be wrong.  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 - Denies neck, shoulder pain bilaterally, and left arm pain.    Social History   Tobacco Use  . Smoking status: Current Some Day Smoker    Types: Cigarettes  . Smokeless tobacco: Never Used  . Tobacco comment: smokes 1 pack per week  Substance Use Topics  . Alcohol use: Yes    Comment: 12+ beverages   . Drug use: No    Review of Systems Per HPI unless specifically indicated above     Objective:    BP 114/69 (BP Location: Right Arm, Patient Position: Sitting, Cuff Size: Large)   Temp 97.8 F (36.6 C) (Oral)   Ht 5' 5.5" (1.664 m)   Wt 262 lb (118.8 kg)   BMI 42.94 kg/m   Wt Readings from Last 3 Encounters:  12/19/16 262 lb (118.8 kg)  10/03/16 262 lb 6.4 oz (119 kg)  09/19/16 266 lb 3.2 oz (120.7 kg)    Physical Exam General - morbidly obese, well-appearing, NAD HEENT - Normocephalic, atraumatic Neck - supple, non-tender, no bony tenderness, full ROM Heart - RRR, no murmurs heard Lungs - Clear throughout all lobes, no wheezing, crackles, or rhonchi. Normal work of breathing. Extremeties - non-tender, no edema, cap refill < 2 seconds, peripheral pulses intact +2 bilaterally Musculoskeletal - Full AROM neck, shoulders, elbows, wrists hands bilaterally, negative Phalen and Tinel tests, negative for weakness of pincer grasp, no scaphoid snuff box. Skin - warm, dry Neuro -  awake, alert, oriented x3, normal gait Psych - Normal mood and affect, normal behavior   Results for orders placed or performed in visit on 10/03/16  Lipid panel  Result Value Ref Range   Cholesterol 225 (H) <200 mg/dL   Triglycerides 164 (H) <150 mg/dL   HDL 27 (L) >40 mg/dL   Total CHOL/HDL Ratio 8.3 (H) <5.0 Ratio   VLDL 33 (H) <30 mg/dL   LDL Cholesterol 165 (H) <100 mg/dL  PSA, total and free  Result Value Ref Range   PSA, Total 0.3 <=4.0 ng/mL   PSA, Free 0.1 Not Estab ng/mL   PSA, % Free 33 >25 %  CBC with Differential  Result Value Ref Range   WBC 4.6 3.8 - 10.8 K/uL   RBC 5.24 4.20 - 5.80 MIL/uL   Hemoglobin 16.0 13.2 - 17.1 g/dL   HCT 48.0 38.5 - 50.0 %   MCV 91.6 80.0 - 100.0 fL   MCH 30.5 27.0 - 33.0 pg   MCHC 33.3 32.0 - 36.0 g/dL   RDW 14.6 11.0 - 15.0 %   Platelets 283 140 - 400 K/uL   MPV 9.5 7.5 - 12.5 fL   Neutro Abs 2,070 1,500 - 7,800 cells/uL   Lymphs Abs 2,070 850 - 3,900 cells/uL   Monocytes Absolute 230 200 - 950 cells/uL   Eosinophils Absolute 184 15 - 500 cells/uL   Basophils Absolute 46 0 - 200  cells/uL   Neutrophils Relative % 45 %   Lymphocytes Relative 45 %   Monocytes Relative 5 %   Eosinophils Relative 4 %   Basophils Relative 1 %   Smear Review Criteria for review not met   TSH  Result Value Ref Range   TSH 1.22 0.40 - 4.50 mIU/L  Comprehensive metabolic panel  Result Value Ref Range   Sodium 139 135 - 146 mmol/L   Potassium 4.2 3.5 - 5.3 mmol/L   Chloride 106 98 - 110 mmol/L   CO2 18 (L) 20 - 32 mmol/L   Glucose, Bld 97 65 - 99 mg/dL   BUN 16 7 - 25 mg/dL   Creat 0.70 0.60 - 1.35 mg/dL   Total Bilirubin 0.8 0.2 - 1.2 mg/dL   Alkaline Phosphatase 106 40 - 115 U/L   AST 41 (H) 10 - 40 U/L   ALT 66 (H) 9 - 46 U/L   Total Protein 7.3 6.1 - 8.1 g/dL   Albumin 4.3 3.6 - 5.1 g/dL   Calcium 9.0 8.6 - 10.3 mg/dL      Assessment & Plan:   Problem List Items Addressed This Visit      Nervous and Auditory   Neuropathy    Pt w/  chronic neuropathy w/ tingling and numbness of right hand and some tingling and numbness of left hand.   Pt w/o decreased ROM.  - DM assessed and is not likely cause.  - Possible cervical radiculopathy, but negative for radiating pain.  Likely radial and ulnar nerve impingement at level of wrist complicated by repetitive work activities.  Plan:  1. Reinforced conservative therapy: - Continue to treat with NSAIDs ibuprofen or naproxen OTC prn. - Apply heat and/or ice to affected area. -  May also apply a muscle rub with lidocaine after heat or ice.  2. START prednisone 10 day taper. Take prednisone taper 20 mg tablets Day 1-4 take 3 pills at one time; Day 5-6: Take 2 pills; Day 7-8: Take 1 pills; Day 9-10: Take 1/2 pill; then stop. 3. May continue gabapentin 400 mg at bedtime.  Consider further increasing med if needed. 4. Recommend workplace safety evaluation.  Encouraged modification of wrist movements, resting hand when not at work.  Pt agrees to OT evaluation and treatment. 5. Referral placed for orthopedics for further evaluation. 6. Follow up as needed if continues to have no improvement.        Other Visit Diagnoses    Numbness and tingling of right hand    -  Primary   See AP neuropathy.   Recommend OT and orthopedic referral.   Relevant Medications   predniSONE (DELTASONE) 20 MG tablet   Other Relevant Orders   AMB referral to orthopedics   Ambulatory referral to Occupational Therapy      Meds ordered this encounter  Medications  . predniSONE (DELTASONE) 20 MG tablet    Sig: Day 1-4 take 3 pills once daily.  Day 5-6 take 2 pills.  Day 7-8 take 1 pill.  Day 9-10 take 1/2 pill.    Dispense:  19 tablet    Refill:  0    Order Specific Question:   Supervising Provider    Answer:   Olin Hauser [[id number]]    Follow up plan: Return if symptoms worsen or fail to improve.  Cassell Smiles, DNP, AGPCNP-BC Adult Gerontology Primary Care Nurse Practitioner [facility] 01/02/2017, 10:05 AM

## 2016-12-19 NOTE — Patient Instructions (Addendum)
Carl Thornton , Thank you for coming in to clinic today.  1. For your hand: - There is irritation of a nerve that supplies sensation to your fingers. START predninsone: Take prednisone taper 20 mg tablets Day 1-4 (Today): Take 3 pills at one time Day 5-6: Take 2 pills  Day 7-8: Take 1 pills Day 9-10: Take 1/2 pill then stop.  - Use heat and ice.  Apply this for 15 minutes at a time 6-8 times per day.   - Rest your hand when you are not working.  2. If orthopedics is not helpful for finding an answer, I would recommend either a nerve conduction study or evaluation of your cervical spine. 3. START occupational therapy to help you with your repetitive use of your hand with chopping. They will call you to schedule.  If you haven't hear from them in 2 weeks, call us.  Please schedule a follow-up appointment with Carl Thornton, AGNP. Return if symptoms worsen or fail to improve.  If you have any other questions or concerns, please feel free to call the clinic or send a message through MyChart. You may also schedule an earlier appointment if necessary.  You will receive a survey after today's visit either digitally by e-mail or paper by [employer] mail. Your experiences and feedback matter to us.  Please respond so we know how we are doing as we provide care for you.   Carl McardleLauren Drea Jurewicz, DNP, AGNP-BC Adult Gerontology Nurse Practitioner [facility]

## 2017-01-02 ENCOUNTER — Other Ambulatory Visit: Payer: Self-pay | Admitting: Nurse Practitioner

## 2017-01-02 ENCOUNTER — Ambulatory Visit: Payer: Managed Care, Other (non HMO) | Attending: Nurse Practitioner | Admitting: Occupational Therapy

## 2017-01-02 ENCOUNTER — Encounter: Payer: Self-pay | Admitting: Nurse Practitioner

## 2017-01-02 DIAGNOSIS — R7303 Prediabetes: Secondary | ICD-10-CM

## 2017-01-02 DIAGNOSIS — E782 Mixed hyperlipidemia: Secondary | ICD-10-CM

## 2017-01-02 NOTE — Assessment & Plan Note (Signed)
Pt w/ chronic neuropathy w/ tingling and numbness of right hand and some tingling and numbness of left hand.   Pt w/o decreased ROM.  - DM assessed and is not likely cause.  - Possible cervical radiculopathy, but negative for radiating pain.  Likely radial and ulnar nerve impingement at level of wrist complicated by repetitive work activities.  Plan:  1. Reinforced conservative therapy: - Continue to treat with NSAIDs ibuprofen or naproxen OTC prn. - Apply heat and/or ice to affected area. -  May also apply a muscle rub with lidocaine after heat or ice.  2. START prednisone 10 day taper. Take prednisone taper 20 mg tablets Day 1-4 take 3 pills at one time; Day 5-6: Take 2 pills; Day 7-8: Take 1 pills; Day 9-10: Take 1/2 pill; then stop. 3. May continue gabapentin 400 mg at bedtime.  Consider further increasing med if needed. 4. Recommend workplace safety evaluation.  Encouraged modification of wrist movements, resting hand when not at work.  Pt agrees to OT evaluation and treatment. 5. Referral placed for orthopedics for further evaluation. 6. Follow up as needed if continues to have no improvement.

## 2017-03-21 ENCOUNTER — Other Ambulatory Visit: Payer: Self-pay

## 2017-03-21 ENCOUNTER — Encounter: Payer: Self-pay | Admitting: Nurse Practitioner

## 2017-03-21 ENCOUNTER — Ambulatory Visit: Payer: Managed Care, Other (non HMO) | Admitting: Nurse Practitioner

## 2017-03-21 VITALS — BP 110/58 | HR 102 | Temp 100.6°F | Resp 18 | Ht 65.5 in | Wt 261.0 lb

## 2017-03-21 DIAGNOSIS — R05 Cough: Secondary | ICD-10-CM

## 2017-03-21 DIAGNOSIS — J111 Influenza due to unidentified influenza virus with other respiratory manifestations: Secondary | ICD-10-CM | POA: Diagnosis not present

## 2017-03-21 DIAGNOSIS — R059 Cough, unspecified: Secondary | ICD-10-CM

## 2017-03-21 LAB — POCT INFLUENZA A/B
Influenza A, POC: NEGATIVE
Influenza B, POC: NEGATIVE

## 2017-03-21 MED ORDER — BENZONATATE 100 MG PO CAPS: 100.0000 mg | ORAL_CAPSULE | Freq: Three times a day (TID) | ORAL | 0 refills | Status: DC | PRN

## 2017-03-21 NOTE — Progress Notes (Signed)
Subjective:    Patient ID: Carl Thornton, male    DOB: 07-11-1970, 47 y.o.   MRN: [id number]  Carl Thornton is a 47 y.o. male presenting on 03/21/2017 for Cough (cold hands, weakness, and upper abdominal pain from deep cough x 1 week )   HPI Flu-like Symptoms Symptom onset 7 days ago.  Pt has been having non-productive cough, fever, chills, sweats, fatigue, body aches, malaise x last 7 days. He has continued to work, but has felt very poorly.   - He reports he now has upper abdominal muscle pain 2/2 coughing. - Pt has taken guaifenesin-DM, ibuprofen for symptoms without much relief. - Two children in his home have tested positive for influenza A.  No other known sick contacts.  Social History   Tobacco Use  . Smoking status: Current Some Day Smoker    Types: Cigarettes  . Smokeless tobacco: Never Used  . Tobacco comment: smokes 1 pack per week  Substance Use Topics  . Alcohol use: Yes    Comment: 12+ beverages   . Drug use: No    Review of Systems Per HPI unless specifically indicated above     Objective:    BP (!) 110/58 (BP Location: Right Arm, Patient Position: Sitting, Cuff Size: Large)   Pulse (!) 102   Temp (!) 100.6 F (38.1 C) (Oral)   Resp 18   Ht 5' 5.5" (1.664 m)   Wt 261 lb (118.4 kg)   SpO2 94%   BMI 42.77 kg/m   Wt Readings from Last 3 Encounters:  03/21/17 261 lb (118.4 kg)  12/19/16 262 lb (118.8 kg)  10/03/16 262 lb 6.4 oz (119 kg)    Physical Exam  General - morbidly obese, sickly-appearing, mild distress HEENT - Normocephalic, atraumatic, PERRL, EOMI, patent nares w/ edema and rhinorrhea, oropharynx erythematous (Mallampati Score 3 - Visualization of only base of uvula), MMM, TM normal, Ear canal slightly erythematous, external ear normal w/o lesions Neck - supple, non-tender, anterior cervical LAD Heart - RRR, no murmurs heard Lungs - Clear throughout all lobes, no wheezing, crackles, or rhonchi. Normal work of  breathing. Extremeties - non-tender, no edema, cap refill < 2 seconds, peripheral pulses intact +2 bilaterally Skin - very warm, dry, no rashes Neuro - awake, alert, oriented x3, normal gait Psych - Normal mood and affect, normal behavior   Results for orders placed or performed in visit on 10/03/16  Lipid panel  Result Value Ref Range   Cholesterol 225 (H) <200 mg/dL   Triglycerides 164 (H) <150 mg/dL   HDL 27 (L) >40 mg/dL   Total CHOL/HDL Ratio 8.3 (H) <5.0 Ratio   VLDL 33 (H) <30 mg/dL   LDL Cholesterol 165 (H) <100 mg/dL  PSA, total and free  Result Value Ref Range   PSA, Total 0.3 <=4.0 ng/mL   PSA, Free 0.1 Not Estab ng/mL   PSA, % Free 33 >25 %  CBC with Differential  Result Value Ref Range   WBC 4.6 3.8 - 10.8 K/uL   RBC 5.24 4.20 - 5.80 MIL/uL   Hemoglobin 16.0 13.2 - 17.1 g/dL   HCT 48.0 38.5 - 50.0 %   MCV 91.6 80.0 - 100.0 fL   MCH 30.5 27.0 - 33.0 pg   MCHC 33.3 32.0 - 36.0 g/dL   RDW 14.6 11.0 - 15.0 %   Platelets 283 140 - 400 K/uL   MPV 9.5 7.5 - 12.5 fL   Neutro Abs 2,070 1,500 - 7,800  cells/uL   Lymphs Abs 2,070 850 - 3,900 cells/uL   Monocytes Absolute 230 200 - 950 cells/uL   Eosinophils Absolute 184 15 - 500 cells/uL   Basophils Absolute 46 0 - 200 cells/uL   Neutrophils Relative % 45 %   Lymphocytes Relative 45 %   Monocytes Relative 5 %   Eosinophils Relative 4 %   Basophils Relative 1 %   Smear Review Criteria for review not met   TSH  Result Value Ref Range   TSH 1.22 0.40 - 4.50 mIU/L  Comprehensive metabolic panel  Result Value Ref Range   Sodium 139 135 - 146 mmol/L   Potassium 4.2 3.5 - 5.3 mmol/L   Chloride 106 98 - 110 mmol/L   CO2 18 (L) 20 - 32 mmol/L   Glucose, Bld 97 65 - 99 mg/dL   BUN 16 7 - 25 mg/dL   Creat 0.70 0.60 - 1.35 mg/dL   Total Bilirubin 0.8 0.2 - 1.2 mg/dL   Alkaline Phosphatase 106 40 - 115 U/L   AST 41 (H) 10 - 40 U/L   ALT 66 (H) 9 - 46 U/L   Total Protein 7.3 6.1 - 8.1 g/dL   Albumin 4.3 3.6 - 5.1 g/dL     Calcium 9.0 8.6 - 10.3 mg/dL      Assessment & Plan:   Problem List Items Addressed This Visit    None    Visit Diagnoses    Influenza  -  Primary Cough     Clinically diagnosed influenza despite negative rapid flu test today, concern for flu still due to significant known exposure to positive influenza.  - Duration x 7 days, without complication. Tolerating PO and well hydrated - No other focal findings of infection today, but mild ear canal erythema - Did receive influenza vaccine this season  Plan: 1. Pt is not a candidate for tamiflu. 2. Supportive care as advised with NSAID / Tylenol PRN fever/myalgias, improve hydration, may take OTC Cold/Flu meds 3. Start tessalon 100 mg tid prn cough. 4. Return criteria given if significant worsening, consider post-influenza complications, otherwise follow-up if needed - Will consider antibiotics if ear infection/ear pain occurs.  Pt should call clinic.   Relevant Medications   benzonatate (TESSALON) 100 MG capsule   Other Relevant Orders   POCT Influenza A/B      Meds ordered this encounter  Medications  . benzonatate (TESSALON) 100 MG capsule    Sig: Take 1 capsule (100 mg total) by mouth 3 (three) times daily as needed for cough.    Dispense:  30 capsule    Refill:  0    Order Specific Question:   Supervising Provider    Answer:   Olin Hauser [[id number]]      Follow up plan: Return 5-7 days if symptoms worsen or fail to improve.  Cassell Smiles, DNP, AGPCNP-BC Adult Gerontology Primary Care Nurse Practitioner [facility] 03/21/2017, 11:53 AM

## 2017-03-21 NOTE — Patient Instructions (Signed)
Carl Thornton, Thank you for coming in to clinic today.  1. Your flu test was NEGATIVE.  It is possible you can still have the flu with a negative test, otherwise it could be a different virus causing your symptoms.  - Wash hands and cover cough very well to avoid spread of infection - For symptom control:      - Take Ibuprofen / Advil 400-600mg  every 6-8 hours as needed for fever / muscle aches.  You may also take Tylenol 500-1000mg  per dose every 6-8 hours or 3 times a day.  You can alternate dosing and take both in the same day.      - Do not take more than 3,000 mg acetaminophen (Tylenol) in a day.      - Start Tessalon perles one every 8 hours or 3 times a day as needed for cough.      - Start OTC Mucinex-DM for cough and congestion for up to 7 days. - Improve hydration with plenty of clear fluids.  Drink up to 8 glasses of water / fluids each day.  If significant worsening with poor fluid intake, worsening fever, difficulty breathing due to coughing, worsening body aches, weakness, or other more concerning symptoms difficulty breathing you can seek treatment at Emergency Department. If your flu symptoms have improved and then get worse several days to a week later with concerns for bronchitis, productive cough, fever, and chills we may need to check for possible pneumonia that can occur after the flu.  Please schedule a follow-up appointment with Wilhelmina McardleLauren Beckie Viscardi, AGNP in 1-2 weeks as needed if worsening from Flu / Bronchitis.  If you have any other questions or concerns, please feel free to call the clinic or send a message through MyChart. You may also schedule an earlier appointment if necessary.  You will receive a survey after today's visit either digitally by e-mail or paper by [employer] mail. Your experiences and feedback matter to us.  Please respond so we know how we are doing as we provide care for you.   [name], DNP, AGNP-BC Adult Gerontology Nurse Practitioner [facility], [facility]

## 2017-03-25 ENCOUNTER — Telehealth: Payer: Self-pay | Admitting: Nurse Practitioner

## 2017-03-25 DIAGNOSIS — B9689 Other specified bacterial agents as the cause of diseases classified elsewhere: Secondary | ICD-10-CM

## 2017-03-25 DIAGNOSIS — J019 Acute sinusitis, unspecified: Principal | ICD-10-CM

## 2017-03-25 MED ORDER — AMOXICILLIN-POT CLAVULANATE 875-125 MG PO TABS: 1.0000 | ORAL_TABLET | Freq: Two times a day (BID) | ORAL | 0 refills | Status: AC

## 2017-03-25 NOTE — Telephone Encounter (Signed)
The pt wife was notified.

## 2017-03-25 NOTE — Telephone Encounter (Signed)
Wife  Called states that pt was still coughing wanted something to be called into  [facility]   Carl Thornton . Pt  Wife call back # is [phone]

## 2017-03-25 NOTE — Telephone Encounter (Signed)
I have sent Augmentin.  Take 1 tablet twice daily (about every 12 hours) for 10 days.

## 2017-12-11 ENCOUNTER — Other Ambulatory Visit: Payer: Self-pay

## 2017-12-11 ENCOUNTER — Encounter: Payer: Self-pay | Admitting: Nurse Practitioner

## 2017-12-11 ENCOUNTER — Ambulatory Visit
Admission: RE | Admit: 2017-12-11 | Discharge: 2017-12-11 | Disposition: A | Discharge status: Home / Self Care | Payer: Managed Care, Other (non HMO) | Source: Ambulatory Visit | Attending: Nurse Practitioner | Admitting: Nurse Practitioner

## 2017-12-11 ENCOUNTER — Ambulatory Visit: Payer: Managed Care, Other (non HMO) | Admitting: Nurse Practitioner

## 2017-12-11 VITALS — BP 113/71 | HR 75 | Temp 98.1°F | Resp 17 | Ht 65.5 in | Wt 263.0 lb

## 2017-12-11 DIAGNOSIS — R05 Cough: Secondary | ICD-10-CM

## 2017-12-11 DIAGNOSIS — M25522 Pain in left elbow: Secondary | ICD-10-CM | POA: Diagnosis not present

## 2017-12-11 DIAGNOSIS — E782 Mixed hyperlipidemia: Secondary | ICD-10-CM | POA: Diagnosis not present

## 2017-12-11 DIAGNOSIS — R059 Cough, unspecified: Secondary | ICD-10-CM

## 2017-12-11 MED ORDER — BENZONATATE 100 MG PO CAPS: 100.0000 mg | ORAL_CAPSULE | Freq: Three times a day (TID) | ORAL | 0 refills | Status: DC | PRN

## 2017-12-11 MED ORDER — SIMVASTATIN 20 MG PO TABS: 20.0000 mg | ORAL_TABLET | Freq: Every day | ORAL | 5 refills | Status: DC

## 2017-12-11 NOTE — Progress Notes (Signed)
Subjective:    Patient ID: Carl Thornton, male    DOB: 1970/06/30, 47 y.o.   MRN: [id number]  Carl Thornton Derrell Lolling is a 47 y.o. male presenting on 12/11/2017 for URI (coughing, post nasal drainage, productive coughing x ) and Arm Pain (intermittient left arm pain x 1 week )   HPI Cough Productive cough x 1 month.  Worse at night.  More productive also at night.  Wheezing, post nasal drainage.  Patient denies sinus pressure/pain, ear pressure, pain, nasal congestion, fever, chills, or sweats.   Left arm pain Sharp with specific twisting/turning movements in elbow.  No pain with regular arm movements/lifting. Only occurring x 1 week.   Social History   Tobacco Use  . Smoking status: Former Smoker    Types: Cigarettes  . Smokeless tobacco: Never Used  Substance Use Topics  . Alcohol use: Yes    Comment: 12+ beverages   . Drug use: No    Review of Systems Per HPI unless specifically indicated above     Objective:    BP 113/71 (BP Location: Left Arm, Patient Position: Sitting, Cuff Size: Large)   Pulse 75   Temp 98.1 F (36.7 C) (Oral)   Resp 17   Ht 5' 5.5" (1.664 m)   Wt 263 lb (119.3 kg)   SpO2 97%   BMI 43.10 kg/m   Wt Readings from Last 3 Encounters:  12/11/17 263 lb (119.3 kg)  03/21/17 261 lb (118.4 kg)  12/19/16 262 lb (118.8 kg)    Physical Exam  Constitutional: He is oriented to person, place, and time. He appears well-developed and well-nourished. No distress.  HENT:  Head: Normocephalic and atraumatic.  Right Ear: Hearing, tympanic membrane, external ear and ear canal normal.  Left Ear: Hearing, tympanic membrane, external ear and ear canal normal.  Nose: Nose normal. Right sinus exhibits no maxillary sinus tenderness and no frontal sinus tenderness. Left sinus exhibits no maxillary sinus tenderness and no frontal sinus tenderness.  Mouth/Throat: Uvula is midline, oropharynx is clear and moist and mucous membranes are normal.  Tonsils are 0 on the right. Tonsils are 0 on the left.  Eyes: Pupils are equal, round, and reactive to light. Conjunctivae and lids are normal.  Cardiovascular: Normal rate, regular rhythm, S1 normal, S2 normal, normal heart sounds and intact distal pulses.  Pulmonary/Chest: Effort normal. No stridor. No respiratory distress. He has no decreased breath sounds. He has no wheezes. He has rhonchi in the right upper field, the right middle field and the right lower field. He has no rales.  Musculoskeletal:       Left shoulder: Normal.       Left elbow: No tenderness (cannot reproduce pain on exam) found.       Left wrist: Normal.  Neurological: He is alert and oriented to person, place, and time.  Skin: Skin is warm and dry.  Psychiatric: He has a normal mood and affect. His behavior is normal.  Vitals reviewed.    Results for orders placed or performed in visit on 03/21/17  POCT Influenza A/B  Result Value Ref Range   Influenza A, POC Negative Negative   Influenza B, POC Negative Negative    DG Chest 2 View CLINICAL DATA:  Productive cough.  Right lung rhonchi.  EXAM: CHEST - 2 VIEW  COMPARISON:  None.  FINDINGS: There is slight peribronchial thickening. No infiltrates or effusions. Heart size and vascularity are normal. Bones are normal.  IMPRESSION: Slight bronchitic changes.  Electronically Signed   By: Francene Boyers M.D.   On: 12/11/2017 17:21  Image personally reviewed prior to radiology reading with identification of bronchitic changes.  Await final radiology report and is consistent with personal interpretation with clinical correlation.     Assessment & Plan:   Problem List Items Addressed This Visit    None    Visit Diagnoses    Cough    -  Primary Acute illness, likely viral URI with post viral cough vs bronchitis.  Negative Chest Xray only with bronchitic changes.  Symptoms not worsening. No identifiable focal infections of ears, nose, throat.  Considered  CAP, but was not confirmed on Xray and patient is afebrile.  Plan: 1. Reassurance, likely self-limited with cough lasting up to few more weeks - Start anti-histamine Loratadine or Cetirizine 10mg  daily,  - also can use Flonase 2 sprays each nostril daily for up to 4-6 weeks - Start Mucinex-DM OTC up to 7-10 days then stop - Start tessalon perles 100 mg every 12 hours as needed for cough. - Chest Xray today. 2. Supportive care with nasal saline, warm herbal tea with honey, 3. Improve hydration 4. Tylenol / Motrin PRN fevers 5. Return criteria given    Relevant Medications   benzonatate (TESSALON) 100 MG capsule   Other Relevant Orders   DG Chest 2 View (Completed)   Mixed hyperlipidemia     Patient stopped simvastatin as he didn't think he needed to continue - is out of refills.  Refills provided.  Labs in about 6 weeks.  Follow-up 6 weeks.   Relevant Medications   simvastatin (ZOCOR) 20 MG tablet   Left elbow pain     Pain likely self-limited.  Muscle strain possible complicated by overuse injury.  Plan:  1. Treat with OTC pain meds (acetaminophen and ibuprofen).  Discussed alternate dosing and max dosing. 2. Apply heat and/or ice to affected area. 3. May also apply a muscle rub with lidocaine or lidocaine patch after heat or ice. 4. Follow up prn no improvement.  Can consider physical therapy.       Meds ordered this encounter  Medications  . benzonatate (TESSALON) 100 MG capsule    Sig: Take 1 capsule (100 mg total) by mouth 3 (three) times daily as needed for cough.    Dispense:  30 capsule    Refill:  0    Order Specific Question:   Supervising Provider    Answer:   Smitty Cords [[id number]]  . simvastatin (ZOCOR) 20 MG tablet    Sig: Take 1 tablet (20 mg total) by mouth at bedtime.    Dispense:  30 tablet    Refill:  5    Order Specific Question:   Supervising Provider    Answer:   Smitty Cords [[id number]]    Follow up plan: Return in about 3 months  (around 03/13/2018) for annual physical.  [name], DNP, AGPCNP-BC Adult Gerontology Primary Care Nurse Practitioner [facility] 12/11/2017, 11:53 AM

## 2017-12-11 NOTE — Patient Instructions (Addendum)
Carl Thornton,   Thank you for coming in to clinic today.  1. Xray today. It sounds like you may have either pneumonia or bronchitis. - START benzonatate perles 100 mg three times daily as needed for cough.  - May start anti-histamine loratadine or cetirizine 10mg  daily, also can use Flonase 2 sprays each nostril daily for up to 4-6 weeks - If congestion is worse, start OTC Mucinex (or may try Mucinex-DM for cough) up to 7-10 days then stop - Drink plenty of fluids to improve congestion - You may try over the counter Nasal Saline spray (Simply Saline, Ocean Spray) as needed to reduce congestion. - Drink warm herbal tea with honey for sore throat. - Start taking Tylenol extra strength 1 to 2 tablets every 6-8 hours for aches or fever/chills for next few days as needed.  Do not take more than 3,000 mg in 24 hours from all medicines.  May take Ibuprofen as well if tolerated 200-400mg  every 8 hours as needed.  If symptoms significantly worsening with persistent fevers/chills despite tylenol/ibpurofen, nausea, vomiting unable to tolerate food/fluids or medicine, body aches, or shortness of breath, sinus pain pressure or worsening productive cough, then follow-up for re-evaluation, may seek more immediate care at Urgent Care or ED if more concerned for emergency.  Please schedule a follow-up appointment with [name], AGNP. Return in about 3 months (around 03/13/2018) for annual physical. Or sooner if needed.  If you have any other questions or concerns, please feel free to call the clinic or send a message through MyChart. You may also schedule an earlier appointment if necessary.  You will receive a survey after today's visit either digitally by e-mail or paper by [employer]. Your experiences and feedback matter to [place].  Please respond so we know how we are doing as we provide care for you.   [name], DNP, AGNP-BC Adult Gerontology Nurse Practitioner [facility], [facility]

## 2017-12-13 ENCOUNTER — Encounter: Payer: Self-pay | Admitting: Nurse Practitioner

## 2018-09-22 ENCOUNTER — Encounter: Payer: Self-pay | Admitting: Nurse Practitioner

## 2018-09-22 ENCOUNTER — Ambulatory Visit (INDEPENDENT_AMBULATORY_CARE_PROVIDER_SITE_OTHER): Payer: No Typology Code available for payment source | Admitting: Nurse Practitioner

## 2018-09-22 ENCOUNTER — Other Ambulatory Visit: Payer: Self-pay

## 2018-09-22 VITALS — BP 112/66 | HR 67 | Ht 65.5 in | Wt 263.2 lb

## 2018-09-22 DIAGNOSIS — E782 Mixed hyperlipidemia: Secondary | ICD-10-CM | POA: Diagnosis not present

## 2018-09-22 DIAGNOSIS — Z114 Encounter for screening for human immunodeficiency virus [HIV]: Secondary | ICD-10-CM | POA: Diagnosis not present

## 2018-09-22 DIAGNOSIS — Z0000 Encounter for general adult medical examination without abnormal findings: Secondary | ICD-10-CM | POA: Diagnosis not present

## 2018-09-22 MED ORDER — SIMVASTATIN 20 MG PO TABS: 20.0000 mg | ORAL_TABLET | Freq: Every day | ORAL | 5 refills | Status: DC

## 2018-09-22 NOTE — Progress Notes (Signed)
Subjective:    Patient ID: Carl Thornton, male    DOB: 07/06/1970, 48 y.o.   MRN: [id number]  Carl Thornton Carl Thornton is a 48 y.o. male presenting on 09/22/2018 for Annual Exam   HPI Annual Physical Exam Patient has been feeling well.  They have no acute concerns today. Sleeps 7-8 hours per night uninterrupted. - He tested positive for coronavirus infection 4 weeks ago.  Has been cleared to return to work.  HEALTH MAINTENANCE: Weight/BMI: stable, but obese Physical activity: sedentary Diet: Regular, high sugar/carb - Patient has stopped drinking so much soda and more water. - Still has diet high in sugar, carbs Seatbelt: always Sunscreen: rarely Colon Cancer Screen: n/a  HIV: due HIV Optometry: encouraged for glaucoma screening Dentistry: regular cleanings  VACCINES: Tetanus: up to date Influenza: Will receive this year at work  Past Medical History:  Diagnosis Date   History of prediabetes    Hypertension    not on medication   Past Surgical History:  Procedure Laterality Date   ANKLE SURGERY     HERNIA REPAIR     umbilical   Social History   Socioeconomic History   Marital status: Married    Spouse name: Not on file   Number of children: Not on file   Years of education: Not on file   Highest education level: Not on file  Occupational History   Not on file  Social Needs   Financial resource strain: Not on file   Food insecurity    Worry: Not on file    Inability: Not on file   Transportation needs    Medical: Not on file    Non-medical: Not on file  Tobacco Use   Smoking status: Former Smoker    Types: Cigarettes   Smokeless tobacco: Never Used  Substance and Sexual Activity   Alcohol use: Not Currently    Frequency: Never    Comment: 12+ beverages    Drug use: No   Sexual activity: Yes  Lifestyle   Physical activity    Days per week: Not on file    Minutes per session: Not on file   Stress: Not on file    Relationships   Social connections    Talks on phone: Not on file    Gets together: Not on file    Attends religious service: Not on file    Active member of club or organization: Not on file    Attends meetings of clubs or organizations: Not on file    Relationship status: Not on file   Intimate partner violence    Fear of current or ex partner: Not on file    Emotionally abused: Not on file    Physically abused: Not on file    Forced sexual activity: Not on file  Other Topics Concern   Not on file  Social History Narrative   Not on file   Family History  Problem Relation Age of Onset   Diabetes Mother    Asthma Father    Diabetes Sister    Heart attack Neg Hx    Stroke Neg Hx    Cancer Neg Hx    Current Outpatient Medications on File Prior to Visit  Medication Sig   simvastatin (ZOCOR) 20 MG tablet Take 1 tablet (20 mg total) by mouth at bedtime. (Patient not taking: Reported on 09/22/2018)   No current facility-administered medications on file prior to visit.     Review of Systems  Constitutional:  Negative for activity change, appetite change, fatigue and unexpected weight change.  HENT: Negative for congestion, hearing loss and trouble swallowing.   Eyes: Negative for visual disturbance.  Respiratory: Negative for choking, shortness of breath and wheezing.   Cardiovascular: Negative for chest pain and palpitations.  Gastrointestinal: Negative for abdominal pain, blood in stool, constipation and diarrhea.  Genitourinary: Negative for difficulty urinating, discharge, flank pain, genital sores, penile pain, penile swelling, scrotal swelling and testicular pain.  Musculoskeletal: Negative for arthralgias, back pain and myalgias.  Skin: Negative for color change, rash and wound.  Allergic/Immunologic: Negative for environmental allergies.  Neurological: Negative for dizziness, seizures, weakness and headaches.  Psychiatric/Behavioral: Negative for behavioral  problems, decreased concentration, dysphoric mood, sleep disturbance and suicidal ideas. The patient is not nervous/anxious.    Per HPI unless specifically indicated above     Objective:    BP 112/66 (BP Location: Left Arm, Patient Position: Sitting, Cuff Size: Large)    Pulse 67    Ht 5' 5.5" (1.664 m)    Wt 263 lb 3.2 oz (119.4 kg)    BMI 43.13 kg/m   Wt Readings from Last 3 Encounters:  09/22/18 263 lb 3.2 oz (119.4 kg)  12/11/17 263 lb (119.3 kg)  03/21/17 261 lb (118.4 kg)    Physical Exam Vitals signs and nursing note reviewed.  Constitutional:      General: He is not in acute distress.    Appearance: He is well-developed. He is obese.  HENT:     Head: Normocephalic and atraumatic.     Right Ear: Tympanic membrane, ear canal and external ear normal.     Left Ear: Tympanic membrane, ear canal and external ear normal.     Nose: Nose normal.     Mouth/Throat:     Mouth: Mucous membranes are moist.     Pharynx: Oropharynx is clear.  Eyes:     Conjunctiva/sclera: Conjunctivae normal.     Pupils: Pupils are equal, round, and reactive to light.  Neck:     Musculoskeletal: Normal range of motion and neck supple.     Thyroid: No thyromegaly.     Vascular: No JVD.     Trachea: No tracheal deviation.  Cardiovascular:     Rate and Rhythm: Normal rate and regular rhythm.     Pulses: Normal pulses.     Heart sounds: Normal heart sounds. No murmur. No friction rub. No gallop.   Pulmonary:     Effort: Pulmonary effort is normal. No respiratory distress.     Breath sounds: Normal breath sounds.  Abdominal:     General: Bowel sounds are normal. There is no distension.     Palpations: Abdomen is soft.     Tenderness: There is no abdominal tenderness.  Musculoskeletal: Normal range of motion.  Lymphadenopathy:     Cervical: No cervical adenopathy.  Skin:    General: Skin is warm and dry.     Capillary Refill: Capillary refill takes less than 2 seconds.  Neurological:      General: No focal deficit present.     Mental Status: He is alert and oriented to person, place, and time. Mental status is at baseline.     Cranial Nerves: No cranial nerve deficit.  Psychiatric:        Mood and Affect: Mood normal.        Behavior: Behavior normal.        Thought Content: Thought content normal.        Judgment:  Judgment normal.    Results for orders placed or performed in visit on 03/21/17  POCT Influenza A/B  Result Value Ref Range   Influenza A, POC Negative Negative   Influenza B, POC Negative Negative      Assessment & Plan:   Problem List Items Addressed This Visit    None    Visit Diagnoses    Encounter for annual physical exam    -  Primary   Screening for HIV without presence of risk factors        Annual physical exam with no new findings.  Well adult with no acute concerns.  Plan: 1. Obtain health maintenance screenings as above according to age. - Increase physical activity to 30 minutes most days of the week.  - Eat healthy diet high in vegetables and fruits; low in refined carbohydrates. - Screening labs and tests as ordered - Patient not taking simvastatin as he thought it was to be stopped after his last fill.  Reviewed with patient need to continue.  Revisit full assessment with labs after meds in 3 months. 2. Return 1 year for annual physical.     Meds ordered this encounter  Medications   simvastatin (ZOCOR) 20 MG tablet    Sig: Take 1 tablet (20 mg total) by mouth at bedtime.    Dispense:  30 tablet    Refill:  5    Order Specific Question:   Supervising Provider    Answer:   Smitty CordsKARAMALEGOS, ALEXANDER J [[id number]]    Follow up plan: Return in about 1 year (around 09/22/2019) for annual physical AND in 3 months for cholesterol.  Wilhelmina McardleLauren Noely Kuhnle, DNP, AGPCNP-BC Adult Gerontology Primary Care Nurse Practitioner [facility] 09/22/2018, 9:03 AM

## 2018-09-22 NOTE — Patient Instructions (Addendum)
Tarron Cay Schillings,   Thank you for coming in to clinic today.  1. Work toward reducing sweets.  Choose yogurt, fruit, or a small piece of dark chocolate instead.  2. Also work toward portion size control.  Eat only a single portion of food at meals.  Please schedule a follow-up appointment with Cassell Smiles, AGNP. Return in about 1 year (around 09/22/2019) for annual physical.  If you have any other questions or concerns, please feel free to call the clinic or send a message through [place]. You may also schedule an earlier appointment if necessary.  You will receive a survey after today's visit either digitally by e-mail or paper by [employer]. Your experiences and feedback matter to [place].  Please respond so we know how we are doing as we provide care for you.   Cassell Smiles, DNP, AGNP-BC Adult Gerontology Nurse Practitioner [place]

## 2018-09-23 LAB — CBC WITH DIFFERENTIAL/PLATELET
Absolute Monocytes: 360 cells/uL (ref 200–950)
Basophils Absolute: 48 cells/uL (ref 0–200)
Basophils Relative: 0.9 %
Eosinophils Absolute: 366 cells/uL (ref 15–500)
Eosinophils Relative: 6.9 %
HCT: 44.3 % (ref 38.5–50.0)
Hemoglobin: 15 g/dL (ref 13.2–17.1)
Lymphs Abs: 1871 cells/uL (ref 850–3900)
MCH: 30.7 pg (ref 27.0–33.0)
MCHC: 33.9 g/dL (ref 32.0–36.0)
MCV: 90.6 fL (ref 80.0–100.0)
MPV: 10.3 fL (ref 7.5–12.5)
Monocytes Relative: 6.8 %
Neutro Abs: 2655 cells/uL (ref 1500–7800)
Neutrophils Relative %: 50.1 %
Platelets: 291 10*3/uL (ref 140–400)
RBC: 4.89 10*6/uL (ref 4.20–5.80)
RDW: 14.5 % (ref 11.0–15.0)
Total Lymphocyte: 35.3 %
WBC: 5.3 10*3/uL (ref 3.8–10.8)

## 2018-09-23 LAB — COMPLETE METABOLIC PANEL WITHOUT GFR
AG Ratio: 1.6 (calc) (ref 1.0–2.5)
ALT: 34 U/L (ref 9–46)
AST: 23 U/L (ref 10–40)
Albumin: 4.1 g/dL (ref 3.6–5.1)
Alkaline phosphatase (APISO): 87 U/L (ref 36–130)
BUN/Creatinine Ratio: 22 (calc) (ref 6–22)
BUN: 11 mg/dL (ref 7–25)
CO2: 24 mmol/L (ref 20–32)
Calcium: 9 mg/dL (ref 8.6–10.3)
Chloride: 106 mmol/L (ref 98–110)
Creat: 0.51 mg/dL — ABNORMAL LOW (ref 0.60–1.35)
GFR, Est African American: 147 mL/min/1.73m2
GFR, Est Non African American: 127 mL/min/1.73m2
Globulin: 2.6 g/dL (ref 1.9–3.7)
Glucose, Bld: 115 mg/dL — ABNORMAL HIGH (ref 65–99)
Potassium: 4.3 mmol/L (ref 3.5–5.3)
Sodium: 138 mmol/L (ref 135–146)
Total Bilirubin: 0.8 mg/dL (ref 0.2–1.2)
Total Protein: 6.7 g/dL (ref 6.1–8.1)

## 2018-09-23 LAB — LIPID PANEL
Cholesterol: 199 mg/dL
HDL: 26 mg/dL — ABNORMAL LOW
LDL Cholesterol (Calc): 129 mg/dL — ABNORMAL HIGH
Non-HDL Cholesterol (Calc): 173 mg/dL — ABNORMAL HIGH
Total CHOL/HDL Ratio: 7.7 (calc) — ABNORMAL HIGH
Triglycerides: 311 mg/dL — ABNORMAL HIGH

## 2018-09-23 LAB — HEMOGLOBIN A1C
Hgb A1c MFr Bld: 6.5 % of total Hgb — ABNORMAL HIGH (ref <5.7)
Mean Plasma Glucose: 140 (calc)
eAG (mmol/L): 7.7 (calc)

## 2018-09-23 LAB — TSH+FREE T4: TSH W/REFLEX TO FT4: 0.99 mIU/L (ref 0.40–4.50)

## 2018-09-23 LAB — HIV ANTIBODY (ROUTINE TESTING W REFLEX): HIV 1&2 Ab, 4th Generation: NONREACTIVE

## 2018-09-24 ENCOUNTER — Encounter: Payer: Self-pay | Admitting: Nurse Practitioner

## 2018-12-22 ENCOUNTER — Ambulatory Visit: Payer: Managed Care, Other (non HMO) | Admitting: Nurse Practitioner

## 2019-04-01 ENCOUNTER — Ambulatory Visit: Payer: No Typology Code available for payment source | Admitting: Family Medicine

## 2019-09-23 ENCOUNTER — Encounter: Payer: No Typology Code available for payment source | Admitting: Family Medicine

## 2019-11-20 ENCOUNTER — Encounter: Payer: No Typology Code available for payment source | Admitting: Family Medicine

## 2019-11-23 ENCOUNTER — Ambulatory Visit (INDEPENDENT_AMBULATORY_CARE_PROVIDER_SITE_OTHER): Payer: No Typology Code available for payment source | Admitting: Family Medicine

## 2019-11-23 ENCOUNTER — Other Ambulatory Visit: Payer: Self-pay

## 2019-11-23 ENCOUNTER — Encounter: Payer: Self-pay | Admitting: Family Medicine

## 2019-11-23 VITALS — BP 121/62 | HR 70 | Temp 98.0°F | Resp 18 | Ht 65.5 in | Wt 283.6 lb

## 2019-11-23 DIAGNOSIS — E782 Mixed hyperlipidemia: Secondary | ICD-10-CM

## 2019-11-23 DIAGNOSIS — R7303 Prediabetes: Secondary | ICD-10-CM

## 2019-11-23 DIAGNOSIS — Z125 Encounter for screening for malignant neoplasm of prostate: Secondary | ICD-10-CM | POA: Insufficient documentation

## 2019-11-23 DIAGNOSIS — R7989 Other specified abnormal findings of blood chemistry: Secondary | ICD-10-CM | POA: Diagnosis not present

## 2019-11-23 DIAGNOSIS — G4730 Sleep apnea, unspecified: Secondary | ICD-10-CM

## 2019-11-23 DIAGNOSIS — Z0000 Encounter for general adult medical examination without abnormal findings: Secondary | ICD-10-CM | POA: Insufficient documentation

## 2019-11-23 DIAGNOSIS — Z1211 Encounter for screening for malignant neoplasm of colon: Secondary | ICD-10-CM | POA: Insufficient documentation

## 2019-11-23 DIAGNOSIS — N521 Erectile dysfunction due to diseases classified elsewhere: Secondary | ICD-10-CM

## 2019-11-23 DIAGNOSIS — R635 Abnormal weight gain: Secondary | ICD-10-CM

## 2019-11-23 LAB — POCT URINALYSIS DIPSTICK
Bilirubin, UA: NEGATIVE
Blood, UA: NEGATIVE
Glucose, UA: NEGATIVE
Ketones, UA: NEGATIVE
Leukocytes, UA: NEGATIVE
Nitrite, UA: NEGATIVE
Protein, UA: NEGATIVE
Spec Grav, UA: 1.015 (ref 1.010–1.025)
Urobilinogen, UA: 0.2 E.U./dL
pH, UA: 5 (ref 5.0–8.0)

## 2019-11-23 MED ORDER — TADALAFIL 20 MG PO TABS: 10.0000 mg | ORAL_TABLET | ORAL | 11 refills | Status: DC | PRN

## 2019-11-23 NOTE — Assessment & Plan Note (Signed)
Abnormal lipid labs drawn 09/2018.  Reports taking simvastatin 20mg  when he remembers and is tolerating well.  Will have repeat labs drawn today.  Plan: 1. Labs to be drawn 2. Continue simvastatin 20mg  nightly

## 2019-11-23 NOTE — Assessment & Plan Note (Signed)
Pt requiring colon cancer screening.  Denies family history of colon cancer.  Plan: - Discussed timing for initiation of colon cancer screening ACS vs USPSTF guidelines - Mutual decision making discussion for options of colonoscopy vs cologuard.  Pt prefers cologuard. - Ordered Cologuard today 

## 2019-11-23 NOTE — Assessment & Plan Note (Signed)
PSA to be drawn today

## 2019-11-23 NOTE — Progress Notes (Signed)
Subjective:    Patient ID: Carl Thornton, male    DOB: 17-Aug-1970, 49 y.o.   MRN: [id number]  Carl Thornton is a 49 y.o. male presenting on 11/23/2019 for Annual Exam   HPI  HEALTH MAINTENANCE:  Weight/BMI: Morbid obesity, BMI 46.48% Physical activity: Stays active Diet: Regular Seatbelt: Yes Sunscreen: Does not wear  Prostate exam/PSA: Will have PSA drawn today Colon cancer screening: Cologuard HIV & Hep C Screening: Offered and declined  GC/CT: Offered and declined  Optometry: Once a year Dentistry: Every 6 months   IMMUNIZATIONS: Tetanus: Up to date, 06/03/2019 Influenza: Reports had through employer this year COVID: [employer] Pf[employer], 01/2019 and 02/2019  Has a history of observed apneas while sleeping with loud snoring and awaking feeling tired/fatigued with daytime tiredness.  Has not had a sleep study in the past.  [facility] 11/23/2019 12/11/2017 09/19/2016  Decreased Interest 0 0 0  Down, Depressed, Hopeless 0 0 0  PHQ - 2 Score 0 0 0  Altered sleeping - - 2  Tired, decreased energy - - 1  Change in appetite - - 2  Feeling bad or failure about yourself  - - 2  Trouble concentrating - - 1  Moving slowly or fidgety/restless - - 0  Suicidal thoughts - - 0  PHQ-9 Score - - 8    Past Medical History:  Diagnosis Date  . History of prediabetes   . Hypertension    not on medication   Past Surgical History:  Procedure Laterality Date  . ANKLE SURGERY    . HERNIA REPAIR     umbilical   Social History   Socioeconomic History  . Marital status: Married    Spouse name: Not on file  . Number of children: Not on file  . Years of education: Not on file  . Highest education level: Not on file  Occupational History  . Not on file  Tobacco Use  . Smoking status: Former Smoker    Types: Cigarettes  . Smokeless tobacco: Never Used  Vaping Use  . Vaping Use: Never used  Substance and Sexual Activity  . Alcohol use: Yes      Comment: 12+ beverages   . Drug use: No  . Sexual activity: Yes  Other Topics Concern  . Not on file  Social History Narrative  . Not on file   Social Determinants of Health   Financial Resource Strain:   . Difficulty of Paying Living Expenses: Not on file  Food Insecurity:   . Worried About Programme researcher, broadcasting/film/videounning Out of Food in the Last Year: Not on file  . Ran Out of Food in the Last Year: Not on file  Transportation Needs:   . Lack of Transportation (Medical): Not on file  . Lack of Transportation (Non-Medical): Not on file  Physical Activity:   . Days of Exercise per Week: Not on file  . Minutes of Exercise per Session: Not on file  Stress:   . Feeling of Stress : Not on file  Social Connections:   . Frequency of Communication with Friends and Family: Not on file  . Frequency of Social Gatherings with Friends and Family: Not on file  . Attends Religious Services: Not on file  . Active Member of Clubs or Organizations: Not on file  . Attends BankerClub or Organization Meetings: Not on file  . Marital Status: Not on file  Intimate Partner Violence:   . Fear of Current or Ex-Partner: Not on file  .  Emotionally Abused: Not on file  . Physically Abused: Not on file  . Sexually Abused: Not on file   Family History  Problem Relation Age of Onset  . Diabetes Mother   . Asthma Father   . Diabetes Sister   . Heart attack Neg Hx   . Stroke Neg Hx   . Cancer Neg Hx    Current Outpatient Medications on File Prior to Visit  Medication Sig  . simvastatin (ZOCOR) 20 MG tablet Take 1 tablet (20 mg total) by mouth at bedtime.   No current facility-administered medications on file prior to visit.    Per HPI unless specifically indicated above     Objective:    BP 121/62 (BP Location: Right Arm, Patient Position: Sitting, Cuff Size: Large)   Pulse 70   Temp 98 F (36.7 C) (Oral)   Resp 18   Ht 5' 5.5" (1.664 m)   Wt 283 lb 9.6 oz (128.6 kg)   SpO2 98%   BMI 46.48 kg/m   Wt Readings  from Last 3 Encounters:  11/23/19 283 lb 9.6 oz (128.6 kg)  09/22/18 263 lb 3.2 oz (119.4 kg)  12/11/17 263 lb (119.3 kg)    Physical Exam Vitals and nursing note reviewed.  Constitutional:      General: He is not in acute distress.    Appearance: Normal appearance. He is well-developed and well-groomed. He is morbidly obese. He is not ill-appearing or toxic-appearing.  HENT:     Head: Normocephalic and atraumatic.     Right Ear: Tympanic membrane, ear canal and external ear normal. There is no impacted cerumen.     Left Ear: Tympanic membrane, ear canal and external ear normal. There is no impacted cerumen.     Nose: Nose normal. No congestion or rhinorrhea.     Mouth/Throat:     Mouth: Mucous membranes are moist.     Pharynx: Oropharynx is clear. No oropharyngeal exudate or posterior oropharyngeal erythema.  Eyes:     General: Lids are normal. Vision grossly intact. No scleral icterus.       Right eye: No discharge or hordeolum.        Left eye: No discharge or hordeolum.     Extraocular Movements: Extraocular movements intact.     Conjunctiva/sclera: Conjunctivae normal.     Pupils: Pupils are equal, round, and reactive to light.  Neck:     Thyroid: No thyroid mass, thyromegaly or thyroid tenderness.  Cardiovascular:     Rate and Rhythm: Normal rate and regular rhythm.     Pulses: Normal pulses.          Dorsalis pedis pulses are 2+ on the right side and 2+ on the left side.     Heart sounds: Normal heart sounds. No murmur heard.  No friction rub. No gallop.   Pulmonary:     Effort: Pulmonary effort is normal. No respiratory distress.     Breath sounds: Normal breath sounds.  Abdominal:     General: Abdomen is flat. Bowel sounds are normal. There is no distension or abdominal bruit.     Palpations: Abdomen is soft.     Tenderness: There is no abdominal tenderness. There is no guarding or rebound.     Hernia: No hernia is present.     Comments: Unable to palpate for  hepatomegaly or splenomegaly due to body habitus  Genitourinary:    Comments: Deferred Musculoskeletal:        General: Normal range of motion.  Cervical back: Normal range of motion and neck supple. No tenderness.     Right lower leg: No edema.     Left lower leg: No edema.     Comments: Normal tone, 5/5 strength BUE & BLE  Feet:     Right foot:     Skin integrity: Skin integrity normal.     Left foot:     Skin integrity: Skin integrity normal.  Lymphadenopathy:     Cervical: No cervical adenopathy.  Skin:    General: Skin is warm and dry.     Capillary Refill: Capillary refill takes less than 2 seconds.  Neurological:     General: No focal deficit present.     Mental Status: He is alert and oriented to person, place, and time.     Cranial Nerves: Cranial nerves are intact. No cranial nerve deficit.     Sensory: Sensation is intact. No sensory deficit.     Motor: Motor function is intact. No weakness.     Coordination: Coordination is intact. Coordination normal.     Gait: Gait is intact. Gait normal.     Deep Tendon Reflexes: Reflexes normal.  Psychiatric:        Attention and Perception: Attention and perception normal.        Mood and Affect: Mood and affect normal.        Speech: Speech normal.        Behavior: Behavior normal. Behavior is cooperative.        Thought Content: Thought content normal.        Cognition and Memory: Cognition and memory normal.        Judgment: Judgment normal.     Results for orders placed or performed in visit on 11/23/19  POCT Urinalysis Dipstick  Result Value Ref Range   Color, UA Yellow    Clarity, UA clear    Glucose, UA Negative Negative   Bilirubin, UA negative    Ketones, UA negative    Spec Grav, UA 1.015 1.010 - 1.025   Blood, UA negative    pH, UA 5.0 5.0 - 8.0   Protein, UA Negative Negative   Urobilinogen, UA 0.2 0.2 or 1.0 E.U./dL   Nitrite, UA negative    Leukocytes, UA Negative Negative   Appearance     Odor         Assessment & Plan:   Problem List Items Addressed This Visit      Respiratory   Observed sleep apnea    Likely OSA based on symptoms, STOPBANG 6/8, morbid obesity, at high risk for OSA.  Recommended sleep study, patient declines at this time, reports his Mother has OSA and uses CPAP.  Plan: 1. Encouraged sleep study, patient requesting to think about this before order is placed 2. RTC 3 months        Other   Prediabetes    History of, will have labs drawn for re-evaluation      Relevant Orders   CBC with Differential   COMPLETE METABOLIC PANEL WITH GFR   HgB H2C   Erectile dysfunction due to diseases classified elsewhere    Pt with continued concerns for decreased libido and erectile dysfunction.  Discussed is likely related to untreated suspected sleep apnea, morbid obesity, history of prediabetes.  Plan: 1. Labs to be drawn for testosterone evaluation this morning 2. Work on lifestyle modifications and towards improvement on chronic conditions 3. Stressed importance of sleep study and treatment if shown to have  OSA 4. Begin cialis 10-20mg  every other day PRN 5. RTC in 3 months for re-evaluation      Relevant Medications   tadalafil (CIALIS) 20 MG tablet   Other Relevant Orders   Testosterone Total,Free,Bio, Males   Hyperlipidemia    Abnormal lipid labs drawn 09/2018.  Reports taking simvastatin 20mg  when he remembers and is tolerating well.  Will have repeat labs drawn today.  Plan: 1. Labs to be drawn 2. Continue simvastatin 20mg  nightly      Relevant Medications   tadalafil (CIALIS) 20 MG tablet   Other Relevant Orders   Lipid Profile   Low vitamin D level    Status unknown.  Recheck labs.  Followup after labs.       Relevant Orders   Vitamin D (25 hydroxy)   Annual physical exam - Primary    Annual physical exam without new findings.  Well adult with acute concerns for erectile dysfunction.  Plan: 1. Obtain health maintenance screenings as  above according to age. - Increase physical activity to 30 minutes most days of the week.  - Eat healthy diet high in vegetables and fruits; low in refined carbohydrates. - Screening labs and tests as ordered 2. Return 1 year for annual physical.       Relevant Orders   POCT Urinalysis Dipstick (Completed)   Screening for colon cancer    Pt requiring colon cancer screening.  Denies family history of colon cancer.  Plan: - Discussed timing for initiation of colon cancer screening ACS vs USPSTF guidelines - Mutual decision making discussion for options of colonoscopy vs cologuard.  Pt prefers cologuard. - Ordered Cologuard today      Relevant Orders   Cologuard   Screening for malignant neoplasm of prostate    PSA to be drawn today      Relevant Orders   PSA    Other Visit Diagnoses    Weight gain       Relevant Orders   TSH + free T4      Meds ordered this encounter  Medications  . tadalafil (CIALIS) 20 MG tablet    Sig: Take 0.5-1 tablets (10-20 mg total) by mouth every other day as needed for erectile dysfunction.    Dispense:  10 tablet    Refill:  11    Follow up plan: Return in about 6 months (around 05/23/2020) for HLD Follow Up.  , FNP-C Family Nurse Practitioner [facility] 11/23/2019, 10:30 AM

## 2019-11-23 NOTE — Assessment & Plan Note (Signed)
Likely OSA based on symptoms, STOPBANG 6/8, morbid obesity, at high risk for OSA.  Recommended sleep study, patient declines at this time, reports his Mother has OSA and uses CPAP.  Plan: 1. Encouraged sleep study, patient requesting to think about this before order is placed 2. RTC 3 months

## 2019-11-23 NOTE — Assessment & Plan Note (Signed)
Pt with continued concerns for decreased libido and erectile dysfunction.  Discussed is likely related to untreated suspected sleep apnea, morbid obesity, history of prediabetes.  Plan: 1. Labs to be drawn for testosterone evaluation this morning 2. Work on lifestyle modifications and towards improvement on chronic conditions 3. Stressed importance of sleep study and treatment if shown to have OSA 4. Begin cialis 10-20mg  every other day PRN 5. RTC in 3 months for re-evaluation

## 2019-11-23 NOTE — Assessment & Plan Note (Signed)
History of, will have labs drawn for re-evaluation

## 2019-11-23 NOTE — Patient Instructions (Signed)
Have your labs drawn and we will contact you with the results.  Contact our office and let us know the dates of your influenza and COVID vaccines.  Well Visit: Care Instructions Overview  Well visits can help you stay healthy. Your provider has checked your overall health and may have suggested ways to take good care of yourself. Your provider also may have recommended tests. At home, you can help prevent illness with healthy eating, regular exercise, and other steps.  Follow-up care is a key part of your treatment and safety. Be sure to make and go to all appointments, and call your provider if you are having problems. It's also a good idea to know your test results and keep a list of the medicines you take.  How can you care for yourself at home?   Get screening tests that you and your doctor decide on. Screening helps find diseases before any symptoms appear.   Eat healthy foods. Choose fruits, vegetables, whole grains, protein, and low-fat dairy foods. Limit fat, especially saturated fat. Reduce salt in your diet.   Limit alcohol. If you are a man, have no more than 2 drinks a day or 14 drinks a week. If you are a woman, have no more than 1 drink a day or 7 drinks a week.   Get at least 30 minutes of physical activity on most days of the week.  We recommend you go no more than 2 days in a row without exercise. Walking is a good choice. You also may want to do other activities, such as running, swimming, cycling, or playing tennis or team sports. Discuss any changes in your exercise program with your provider.   Reach and stay at a healthy weight. This will lower your risk for many problems, such as obesity, diabetes, heart disease, and high blood pressure.   Do not smoke or allow others to smoke around you. If you need help quitting, talk to your provider about stop-smoking programs and medicines. These can increase your chances of quitting for good.  Can call 1-800-QUIT-NOW  (340-262-7885) for the [facility], assistance with smoking cessation.   Care for your mental health. It is easy to get weighed down by worry and stress. Learn strategies to manage stress, like deep breathing and mindfulness, and stay connected with your family and community. If you find you often feel sad or hopeless, talk with your provider. Treatment can help.   Talk to your provider about whether you have any risk factors for sexually transmitted infections (STIs). You can help prevent STIs if you wait to have sex with a new partner (or partners) until you've each been tested for STIs. It also helps if you use condoms (male or male condoms) and if you limit your sex partners to one person who only has sex with you. Vaccines are available for some STIs, such as HPV (these are age dependent).   If you think you may have a problem with alcohol or drug use, talk to your provider. This includes prescription medicines (such as amphetamines and opioids) and illegal drugs (such as cocaine and methamphetamine). Your provider can help you figure out what type of treatment is best for you.   If you have concerns about domestic violence or intimate partner violence, there are resources available to you. National Domestic Abuse Hotline (442)172-7121   Protect your skin from too much sun. When you're outdoors from 10 a.m. to 4 p.m., stay in the shade or cover  up with clothing and a hat with a wide brim. Wear sunglasses that block UV rays. Even when it's cloudy, put broad-spectrum sunscreen (SPF 30 or higher) on any exposed skin.   See a dentist one or two times a year for checkups and to have your teeth cleaned.   See an eye doctor once per year for an eye exam.   Wear a seat belt in the car.  When should you call for help?  Watch closely for changes in your health, and be sure to contact your provider if you have any problems or symptoms that concern you.  We will plan to see you  back in 6 months for hyperlipidemia follow up visit  You will receive a survey after today's visit either digitally by e-mail or paper by USPS mail. Your experiences and feedback matter to [place].  Please respond so we know how we are doing as we provide care for you.  Call us with any questions/concerns/needs.  It is my goal to be available to you for your health concerns.  Thanks for choosing me to be a partner in your healthcare needs!  Charlaine Dalton, FNP-C Family Nurse Practitioner [facility] Phone: [phone]

## 2019-11-23 NOTE — Assessment & Plan Note (Signed)
Annual physical exam without new findings.  Well adult with acute concerns for erectile dysfunction.  Plan: 1. Obtain health maintenance screenings as above according to age. - Increase physical activity to 30 minutes most days of the week.  - Eat healthy diet high in vegetables and fruits; low in refined carbohydrates. - Screening labs and tests as ordered 2. Return 1 year for annual physical.

## 2019-11-23 NOTE — Assessment & Plan Note (Signed)
Status unknown.  Recheck labs.  Followup after labs.  

## 2019-11-24 ENCOUNTER — Other Ambulatory Visit: Payer: Self-pay | Admitting: Family Medicine

## 2019-11-24 DIAGNOSIS — E782 Mixed hyperlipidemia: Secondary | ICD-10-CM

## 2019-11-24 DIAGNOSIS — R7989 Other specified abnormal findings of blood chemistry: Secondary | ICD-10-CM

## 2019-11-24 LAB — COMPLETE METABOLIC PANEL WITH GFR
AG Ratio: 1.5 (calc) (ref 1.0–2.5)
ALT: 44 U/L (ref 9–46)
AST: 32 U/L (ref 10–40)
Albumin: 4.2 g/dL (ref 3.6–5.1)
Alkaline phosphatase (APISO): 90 U/L (ref 36–130)
BUN: 16 mg/dL (ref 7–25)
CO2: 21 mmol/L (ref 20–32)
Calcium: 9 mg/dL (ref 8.6–10.3)
Chloride: 104 mmol/L (ref 98–110)
Creat: 0.66 mg/dL (ref 0.60–1.35)
GFR, Est African American: 132 mL/min/{1.73_m2} (ref >=60)
GFR, Est Non African American: 114 mL/min/{1.73_m2} (ref >=60)
Globulin: 2.8 g/dL (calc) (ref 1.9–3.7)
Glucose, Bld: 116 mg/dL — ABNORMAL HIGH (ref 65–99)
Potassium: 4.1 mmol/L (ref 3.5–5.3)
Sodium: 136 mmol/L (ref 135–146)
Total Bilirubin: 0.9 mg/dL (ref 0.2–1.2)
Total Protein: 7 g/dL (ref 6.1–8.1)

## 2019-11-24 LAB — CBC WITH DIFFERENTIAL/PLATELET
Absolute Monocytes: 364 cells/uL (ref 200–950)
Basophils Absolute: 62 cells/uL (ref 0–200)
Basophils Relative: 1.1 %
Eosinophils Absolute: 151 cells/uL (ref 15–500)
Eosinophils Relative: 2.7 %
HCT: 45.2 % (ref 38.5–50.0)
Hemoglobin: 15.2 g/dL (ref 13.2–17.1)
Lymphs Abs: 2083 cells/uL (ref 850–3900)
MCH: 30.4 pg (ref 27.0–33.0)
MCHC: 33.6 g/dL (ref 32.0–36.0)
MCV: 90.4 fL (ref 80.0–100.0)
MPV: 10.5 fL (ref 7.5–12.5)
Monocytes Relative: 6.5 %
Neutro Abs: 2940 cells/uL (ref 1500–7800)
Neutrophils Relative %: 52.5 %
Platelets: 300 10*3/uL (ref 140–400)
RBC: 5 10*6/uL (ref 4.20–5.80)
RDW: 13.8 % (ref 11.0–15.0)
Total Lymphocyte: 37.2 %
WBC: 5.6 10*3/uL (ref 3.8–10.8)

## 2019-11-24 LAB — HEMOGLOBIN A1C
Hgb A1c MFr Bld: 7.2 % of total Hgb — ABNORMAL HIGH (ref <5.7)
Mean Plasma Glucose: 160 (calc)
eAG (mmol/L): 8.9 (calc)

## 2019-11-24 LAB — PSA: PSA: 0.21 ng/mL (ref <=4.0)

## 2019-11-24 LAB — TESTOSTERONE TOTAL,FREE,BIO, MALES
Albumin: 4.2 g/dL (ref 3.6–5.1)
Sex Hormone Binding: 19 nmol/L (ref 10–50)
Testosterone: 215 ng/dL — ABNORMAL LOW (ref 250–827)

## 2019-11-24 LAB — TSH+FREE T4: TSH W/REFLEX TO FT4: 1.62 mIU/L (ref 0.40–4.50)

## 2019-11-24 LAB — LIPID PANEL
Cholesterol: 228 mg/dL — ABNORMAL HIGH (ref <200)
HDL: 31 mg/dL — ABNORMAL LOW (ref >=40)
LDL Cholesterol (Calc): 166 mg/dL (calc) — ABNORMAL HIGH
Non-HDL Cholesterol (Calc): 197 mg/dL (calc) — ABNORMAL HIGH (ref <130)
Total CHOL/HDL Ratio: 7.4 (calc) — ABNORMAL HIGH (ref <5.0)
Triglycerides: 164 mg/dL — ABNORMAL HIGH (ref <150)

## 2019-11-24 LAB — VITAMIN D 25 HYDROXY (VIT D DEFICIENCY, FRACTURES): Vit D, 25-Hydroxy: 16 ng/mL — ABNORMAL LOW (ref 30–100)

## 2019-11-24 MED ORDER — VITAMIN D (ERGOCALCIFEROL) 1.25 MG (50000 UNIT) PO CAPS: 50000.0000 [IU] | ORAL_CAPSULE | ORAL | 0 refills | Status: AC

## 2019-11-24 MED ORDER — SIMVASTATIN 40 MG PO TABS: 40.0000 mg | ORAL_TABLET | Freq: Every day | ORAL | 3 refills | Status: DC

## 2019-11-26 ENCOUNTER — Encounter: Payer: Self-pay | Admitting: Family Medicine

## 2019-11-26 ENCOUNTER — Ambulatory Visit (INDEPENDENT_AMBULATORY_CARE_PROVIDER_SITE_OTHER): Payer: No Typology Code available for payment source | Admitting: Family Medicine

## 2019-11-26 ENCOUNTER — Other Ambulatory Visit: Payer: Self-pay

## 2019-11-26 VITALS — BP 117/69 | HR 86 | Ht 65.5 in | Wt 278.4 lb

## 2019-11-26 DIAGNOSIS — N521 Erectile dysfunction due to diseases classified elsewhere: Secondary | ICD-10-CM

## 2019-11-26 DIAGNOSIS — R7989 Other specified abnormal findings of blood chemistry: Secondary | ICD-10-CM | POA: Insufficient documentation

## 2019-11-26 DIAGNOSIS — E1165 Type 2 diabetes mellitus with hyperglycemia: Secondary | ICD-10-CM | POA: Diagnosis not present

## 2019-11-26 MED ORDER — BLOOD GLUCOSE METER KIT: PACK | 0 refills | Status: DC

## 2019-11-26 NOTE — Assessment & Plan Note (Signed)
Referral to urology placed

## 2019-11-26 NOTE — Progress Notes (Signed)
Subjective:    Patient ID: Carl Thornton, male    DOB: 04-Apr-1970, 49 y.o.   MRN: [id number]  Loron Isaac Laud Carl Thornton is a 49 y.o. male presenting on 11/26/2019 for Abnormal Lab (pt comes in the office to discuss lab result from his recent physical )   HPI  Mr. Carl Thornton presents to clinic for a follow up on his lab results from his physical.  Denies any acute concerns today.  Depression screen [facility] 2/9 11/23/2019 12/11/2017 09/19/2016  Decreased Interest 0 0 0  Down, Depressed, Hopeless 0 0 0  PHQ - 2 Score 0 0 0  Altered sleeping - - 2  Tired, decreased energy - - 1  Change in appetite - - 2  Feeling bad or failure about yourself  - - 2  Trouble concentrating - - 1  Moving slowly or fidgety/restless - - 0  Suicidal thoughts - - 0  PHQ-9 Score - - 8    Social History   Tobacco Use  . Smoking status: Former Smoker    Types: Cigarettes  . Smokeless tobacco: Never Used  Vaping Use  . Vaping Use: Never used  Substance Use Topics  . Alcohol use: Yes    Comment: 12+ beverages   . Drug use: No    Review of Systems  Constitutional: Negative.   HENT: Negative.   Eyes: Negative.   Respiratory: Negative.   Cardiovascular: Negative.   Gastrointestinal: Negative.   Endocrine: Negative.   Genitourinary: Negative.   Musculoskeletal: Negative.   Skin: Negative.   Allergic/Immunologic: Negative.   Neurological: Negative.   Hematological: Negative.   Psychiatric/Behavioral: Negative.    Per HPI unless specifically indicated above     Objective:    BP 117/69 (BP Location: Left Arm, Patient Position: Sitting, Cuff Size: Large)   Pulse 86   Ht 5' 5.5" (1.664 m)   Wt 278 lb 6.4 oz (126.3 kg)   BMI 45.62 kg/m   Wt Readings from Last 3 Encounters:  11/26/19 278 lb 6.4 oz (126.3 kg)  11/23/19 283 lb 9.6 oz (128.6 kg)  09/22/18 263 lb 3.2 oz (119.4 kg)    Physical Exam Vitals and nursing note reviewed.  Constitutional:      General: He is not in acute  distress.    Appearance: Normal appearance. He is well-developed and well-groomed. He is not ill-appearing or toxic-appearing.  HENT:     Head: Normocephalic and atraumatic.     Nose:     Comments: Lizbeth Bark is in place, covering mouth and nose. Eyes:     General:        Right eye: No discharge.        Left eye: No discharge.     Extraocular Movements: Extraocular movements intact.     Conjunctiva/sclera: Conjunctivae normal.     Pupils: Pupils are equal, round, and reactive to light.  Cardiovascular:     Pulses: Normal pulses.  Pulmonary:     Effort: Pulmonary effort is normal. No respiratory distress.  Skin:    General: Skin is warm and dry.     Capillary Refill: Capillary refill takes less than 2 seconds.  Neurological:     General: No focal deficit present.     Mental Status: He is alert and oriented to person, place, and time.  Psychiatric:        Attention and Perception: Attention and perception normal.        Mood and Affect: Mood and affect normal.  Speech: Speech normal.        Behavior: Behavior normal. Behavior is cooperative.        Thought Content: Thought content normal.        Cognition and Memory: Cognition and memory normal.    Results for orders placed or performed in visit on 11/23/19  CBC with Differential  Result Value Ref Range   WBC 5.6 3.8 - 10.8 Thousand/uL   RBC 5.00 4.20 - 5.80 Million/uL   Hemoglobin 15.2 13.2 - 17.1 g/dL   HCT 45.2 38 - 50 %   MCV 90.4 80.0 - 100.0 fL   MCH 30.4 27.0 - 33.0 pg   MCHC 33.6 32.0 - 36.0 g/dL   RDW 13.8 11.0 - 15.0 %   Platelets 300 140 - 400 Thousand/uL   MPV 10.5 7.5 - 12.5 fL   Neutro Abs 2,940 1,500 - 7,800 cells/uL   Lymphs Abs 2,083 850 - 3,900 cells/uL   Absolute Monocytes 364 200 - 950 cells/uL   Eosinophils Absolute 151 15.0 - 500.0 cells/uL   Basophils Absolute 62 0.0 - 200.0 cells/uL   Neutrophils Relative % 52.5 %   Total Lymphocyte 37.2 %   Monocytes Relative 6.5 %   Eosinophils Relative  2.7 %   Basophils Relative 1.1 %  COMPLETE METABOLIC PANEL WITH GFR  Result Value Ref Range   Glucose, Bld 116 (H) 65 - 99 mg/dL   BUN 16 7 - 25 mg/dL   Creat 0.66 0.60 - 1.35 mg/dL   GFR, Est Non African American 114 > OR = 60 mL/min/1.59m   GFR, Est African American 132 > OR = 60 mL/min/1.754m  BUN/Creatinine Ratio NOT APPLICABLE 6 - 22 (calc)   Sodium 136 135 - 146 mmol/L   Potassium 4.1 3.5 - 5.3 mmol/L   Chloride 104 98 - 110 mmol/L   CO2 21 20 - 32 mmol/L   Calcium 9.0 8.6 - 10.3 mg/dL   Total Protein 7.0 6.1 - 8.1 g/dL   Albumin 4.2 3.6 - 5.1 g/dL   Globulin 2.8 1.9 - 3.7 g/dL (calc)   AG Ratio 1.5 1.0 - 2.5 (calc)   Total Bilirubin 0.9 0.2 - 1.2 mg/dL   Alkaline phosphatase (APISO) 90 36 - 130 U/L   AST 32 10 - 40 U/L   ALT 44 9 - 46 U/L  Lipid Profile  Result Value Ref Range   Cholesterol 228 (H) <200 mg/dL   HDL 31 (L) > OR = 40 mg/dL   Triglycerides 164 (H) <150 mg/dL   LDL Cholesterol (Calc) 166 (H) mg/dL (calc)   Total CHOL/HDL Ratio 7.4 (H) <5.0 (calc)   Non-HDL Cholesterol (Calc) 197 (H) <130 mg/dL (calc)  TSH + free T4  Result Value Ref Range   TSH W/REFLEX TO FT4 1.62 0.40 - 4.50 mIU/L  PSA  Result Value Ref Range   PSA 0.21 < OR = 4.0 ng/mL  Testosterone Total,Free,Bio, Males  Result Value Ref Range   Testosterone 215 (L) 250 - 827 ng/dL   Albumin 4.2 3.6 - 5.1 g/dL   Sex Hormone Binding 19 10 - 50 nmol/L   Testosterone, Free See below 46.0 - 224.0 pg/mL   Testosterone, Bioavailable  110.0 - 575 ng/dL  Hemoglobin A1c  Result Value Ref Range   Hgb A1c MFr Bld 7.2 (H) <5.7 % of total Hgb   Mean Plasma Glucose 160 (calc)   eAG (mmol/L) 8.9 (calc)  VITAMIN D 25 Hydroxy (Vit-D Deficiency, Fractures)  Result Value  Ref Range   Vit D, 25-Hydroxy 16 (L) 30 - 100 ng/mL  POCT Urinalysis Dipstick  Result Value Ref Range   Color, UA Yellow    Clarity, UA clear    Glucose, UA Negative Negative   Bilirubin, UA negative    Ketones, UA negative    Spec  Grav, UA 1.015 1.010 - 1.025   Blood, UA negative    pH, UA 5.0 5.0 - 8.0   Protein, UA Negative Negative   Urobilinogen, UA 0.2 0.2 or 1.0 E.U./dL   Nitrite, UA negative    Leukocytes, UA Negative Negative   Appearance     Odor        Assessment & Plan:   Problem List Items Addressed This Visit      Endocrine   Type 2 diabetes mellitus with hyperglycemia, without long-term current use of insulin (HCC) - Primary    New diagnosis of T2DM.  Discussed lifestyle and dietary modifications vs beginning metformin.  Patient interested in 3 months of lifestyle and dietary modifications prior to starting medications.  Reviewed starches to avoid and increasing vegetable intake.  Diabetes paperwork and booklets provided for full education and reviewed.  Provided with glucose log and CMA educated/instructed with in office CBG testing so patient is comfortable with process at home.  Provided with Rx for glucometer and testing supplies.  Given paperwork for warning signs for hypo/hyperglycemia.  Discussed to RTC in 3 months for updated A1C and re-evaluation of dietary and lifestyle modifications.  Patient in agreement with plan.      Relevant Medications   blood glucose meter kit and supplies     Other   Erectile dysfunction due to diseases classified elsewhere    Referral to urology for ED with low testosterone by lab work.  Patient requesting referral.      Relevant Orders   Ambulatory referral to Urology   Low testosterone    Referral to urology placed      Relevant Orders   Ambulatory referral to Urology      Meds ordered this encounter  Medications  . blood glucose meter kit and supplies    Sig: Dispense based on patient and insurance preference. Use up to four times daily as directed. (FOR ICD-10 E10.9, E11.9).    Dispense:  1 each    Refill:  0    Order Specific Question:   Number of strips    Answer:   300    Order Specific Question:   Number of lancets    Answer:   300     Follow up plan: Return in about 3 months (around 02/26/2020) for DM, A1C F/U.   Harlin Rain, [place] [facility] Nurse Practitioner [facility] 11/26/2019, 4:43 PM

## 2019-11-26 NOTE — Patient Instructions (Signed)
As we discussed, you have a diagnosis of Diabetes.  It is important that you begin to change your diet and lifestyle.  Begin to work on changing your starches from white to brown/whole grain.  Example, switching from white rice to brown rice, white bread to whole wheat bread.  Switch from sweet tea to unsweet tea.  Making small lifestyle changes with each meal can add up to a lower A1C and potentially reverse your diagnosis of diabetes.  Take your blood glucose once per day and record and log on the sheet provided.  Bring that back to your appointment in 3 months.  We can treat you with dietary and lifestyle modifications now and re-assess what your A1C looks like in 3 months.  It is important that you schedule an appointment with an eye doctor for a visual exam and let them know that you have diabetes.  Your provider would like to you have your annual eye exam. Please contact your current eye doctor or here are some good options for you to contact.   [facility]   Address: [address] [place], [place] [address] Phone: [phone]  Website: [email or web address]   [facility] [address], [place], [place] [address] Phone: [phone] [email or web address]  [facility]  Address: [address] [place], [place], [place] [address] Phone: [phone]   [facility] [address] [place], [place] [address] Phone: [phone]  [facility] Address: [address] [place], [place], [place] [address]  Phone: [phone]  Advice on Protecting Your Feet   1. Daily foot inspections - Look for any breaks in the skin or areas of irritation such as blisters or red areas. Report to primary doctor or foot nurse immediately if any problems occur.  - If you have vision problems and cannot see your feet well or it is hard for you to reach your feet, ask a family member to inspect your feet daily.   2. Daily foot hygiene  - Wash feet daily, but do not soak your feet in hot  water. If you do have callus, you may use warm water epsom salt foot soak and use moisturizer after. - Dry well especially between toes; Pat dry do not rub.  - If your skin is dry use a lotion to moisturize but never between the toes.  - If your skin is wet from perspiration, use an antifungal foot powder daily.   3. Shoes and Socks  - Wear a clean pair of socks daily.  - Make sure your shoes fit well and that you are measured and properly fit each time you purchase shoes. Your shoe size may change.  - Powder your shoes with a small amount of an antifungal foot powder daily, as the shoe is the only article of clothing that is not laundered.  - Wear appropriate shoes and socks for the weather. It is especially important to protect your feet from the cold; however, don't forget about sunscreen to the tops of your feet in the hot sun.  - Wear well fitting shoes rather than slippers or flip-flops when walking or standing for long period of time.  - When at home make sure you always have protective foot wear on your feet.   You can learn more information online about your diabetes at American Diabetes Association: http://www.diabetes.org/ - General self-care (diet, medications, blood sugar checks). - Diet recommendations - There are even recipes available for you to look at and  try.   Eat at least 3 meals and 1-2 snacks per day (don't skip breakfast).  Aim for no more than 5 hours between eating. - Tip: If you go >5 hours without eating and become very hungry, your body will supply it's own resources temporarily and you can gain extra weight when you eat.  The 5 Minute Rule of Exercise - Promise yourself to at least do 5 minutes of exercise (make sure you time it), and if at the end of 5 minutes (this is the hardest part of the work-out), if you still feel like you want stop (or not motivated to continue) then allow yourself to stop. Otherwise, more often than not you will feel encouraged that you  can continue for a little while longer or even more!  My 5 to Fitness!  5: fruits and vegetables per day (work on 9 per day if you are at 5) 4: exercise 4-5 times per week for at least 30 minutes (walking counts!) 3: meals per day (don't skip breakfast!), no more than 5 hours between meals 2: habits to quit -smoking -excess alcohol use (men >2 beer/day; women >1beer/day) 1: sweet per day (2 cookies, 1 small cup of ice cream, 12 oz soda)  These are general tips for healthy living. Try to start with 1 or 2 habit TODAY and make it a part of your life for several months. You set a goal today to work on: Once you have 1 or 2 habits down for several months, try to begin working on your next healthy habit. With every single step you take, you will be leading a healthier lifestyle!  Diet Recommendations for Diabetes   Starchy (carb) foods include: Bread, rice, pasta, potatoes, corn, crackers, bagels, muffins, all baked goods.   Protein foods include: Meat, fish, poultry, eggs, dairy foods, and beans such as pinto and kidney beans (beans also provide carbohydrate).   1. Eat at least 3 meals and 1-2 snacks per day. Never go more than 4-5 hours while awake without eating.   2. Limit starchy foods to TWO per meal and ONE per snack. ONE portion of a starchy  food is equal to the following:   - ONE slice of bread (or its equivalent, such as half of a hamburger bun).   - 1/2 cup of a "scoopable" starchy food such as potatoes or rice.   - 1 OUNCE (28 grams) of starchy snacks (crackers or pretzels, look on label).   - 15 grams of carbohydrate as shown on food label.   3. Both lunch and dinner should include a protein food, a carb food, and vegetables.   - Obtain twice as many veg's as protein or carbohydrate foods for both lunch and dinner.   - Try to keep frozen veg's on hand for a quick vegetable serving.     - Fresh or frozen veg's are best.   4. Breakfast should always include protein.    A  referral to Urology has been placed today.  If you have not heard from the specialty office or our referral coordinator within 1 week, please let us know and we will follow up with the referral coordinator for an update.  We will plan to see you back in 3 months for diabetes follow up visit  You will receive a survey after today's visit either digitally by e-mail or paper by USPS mail. Your experiences and feedback matter to [place].  Please respond so we know how we are doing  as we provide care for you.  Call us with any questions/concerns/needs.  It is my goal to be available to you for your health concerns.  Thanks for choosing me to be a partner in your healthcare needs!  Harlin Rain, FNP-C Family Nurse Practitioner [facility] Phone: [phone]

## 2019-11-26 NOTE — Assessment & Plan Note (Signed)
New diagnosis of T2DM.  Discussed lifestyle and dietary modifications vs beginning metformin.  Patient interested in 3 months of lifestyle and dietary modifications prior to starting medications.  Reviewed starches to avoid and increasing vegetable intake.  Diabetes paperwork and booklets provided for full education and reviewed.  Provided with glucose log and CMA educated/instructed with in office CBG testing so patient is comfortable with process at home.  Provided with Rx for glucometer and testing supplies.  Given paperwork for warning signs for hypo/hyperglycemia.  Discussed to RTC in 3 months for updated A1C and re-evaluation of dietary and lifestyle modifications.  Patient in agreement with plan.

## 2019-11-26 NOTE — Assessment & Plan Note (Signed)
Referral to urology for ED with low testosterone by lab work.  Patient requesting referral.

## 2019-12-16 ENCOUNTER — Other Ambulatory Visit: Payer: Self-pay

## 2019-12-16 ENCOUNTER — Ambulatory Visit (INDEPENDENT_AMBULATORY_CARE_PROVIDER_SITE_OTHER): Payer: No Typology Code available for payment source | Admitting: Urology

## 2019-12-16 ENCOUNTER — Encounter: Payer: Self-pay | Admitting: Urology

## 2019-12-16 VITALS — BP 130/80 | HR 74 | Ht 65.0 in | Wt 278.0 lb

## 2019-12-16 DIAGNOSIS — E291 Testicular hypofunction: Secondary | ICD-10-CM

## 2019-12-16 DIAGNOSIS — N5201 Erectile dysfunction due to arterial insufficiency: Secondary | ICD-10-CM

## 2019-12-17 ENCOUNTER — Encounter: Payer: Self-pay | Admitting: Urology

## 2019-12-17 MED ORDER — TADALAFIL 20 MG PO TABS: 10.0000 mg | ORAL_TABLET | ORAL | 11 refills | Status: DC | PRN

## 2019-12-17 NOTE — Progress Notes (Signed)
12/16/2019 4:00 PM   Carl Thornton [birth year] [id number]  Referring provider: Verl Bangs, [place],  [place] [address]  Chief Complaint  Patient presents with  . Erectile Dysfunction    HPI: 49 y.o. male seen at the request of Cyndia Skeeters, FNP for evaluation of erectile dysfunction and hypogonadism   States today he did not know why he was referred to urology  Total testosterone level 11/23/2019 215  PSA 0.21  No bothersome LUTS  States he does have difficulty achieving and maintaining an erection  Rx tadalafil was sent to his pharmacy however he states it has not been filled  Denies pain or curvature with erections  Does complain of tiredness and fatigue however denies decreased libido today  Organic factors include hypertension, hypercholesterolemia and previous tobacco history   PMH: Past Medical History:  Diagnosis Date  . History of prediabetes   . Hypertension    not on medication    Surgical History: Past Surgical History:  Procedure Laterality Date  . ANKLE SURGERY    . HERNIA REPAIR     umbilical    Home Medications:  Allergies as of 12/16/2019   No Known Allergies     Medication List       Accurate as of December 16, 2019 11:59 PM. If you have any questions, ask your nurse or doctor.        blood glucose meter kit and supplies Dispense based on patient and insurance preference. Use up to four times daily as directed. (FOR ICD-10 E10.9, E11.9).   simvastatin 40 MG tablet Commonly known as: ZOCOR Take 1 tablet (40 mg total) by mouth at bedtime.   tadalafil 20 MG tablet Commonly known as: CIALIS Take 0.5-1 tablets (10-20 mg total) by mouth every other day as needed for erectile dysfunction.   Vitamin D (Ergocalciferol) 1.25 MG (50000 UNIT) Caps capsule Commonly known as: DRISDOL Take 1 capsule (50,000 Units total) by mouth every 7 (seven) days for 12 doses.       Allergies: No Known  Allergies  Family History: Family History  Problem Relation Age of Onset  . Diabetes Mother   . Asthma Father   . Diabetes Sister   . Heart attack Neg Hx   . Stroke Neg Hx   . Cancer Neg Hx     Social History:  reports that he has quit smoking. His smoking use included cigarettes. He has never used smokeless tobacco. He reports current alcohol use. He reports that he does not use drugs.   Physical Exam: BP 130/80   Pulse 74   Ht _0  (1.651 m)   Wt 278 lb (126.1 kg)   BMI 46.26 kg/m   Constitutional:  Alert and oriented, No acute distress. HEENT: [place] AT, moist mucus membranes.  Trachea midline, no masses. Cardiovascular: No clubbing, cyanosis, or edema. Respiratory: Normal respiratory effort, no increased work of breathing. GI: Abdomen is soft, nontender, nondistended, no abdominal masses GU: No CVA tenderness Lymph: No cervical or inguinal lymphadenopathy. Skin: No rashes, bruises or suspicious lesions. Neurologic: Grossly intact, no focal deficits, moving all 4 extremities. Psychiatric: Normal mood and affect.   Assessment & Plan:    1.  Erectile dysfunction  We discussed PDE 5 inhibitors and the availability of low cost generic tadalafil with good Rx at [facility]  Was recommended he use the medication at least 5 occasions to adequately determine efficacy  2.  Hypogonadism  Potential side effects  of testosterone replacement were discussed including stimulation of benign prostatic growth with lower urinary tract symptoms; erythrocytosis; edema; gynecomastia; worsening sleep apnea; venous thromboembolism; testicular atrophy and infertility. Recent studies suggesting an increased incidence of heart attack and stroke in patients taking testosterone was discussed. He was informed there is conflicting evidence regarding the impact of testosterone therapy on cardiovascular risk. The theoretical risk of growth stimulation of an undetected prostate cancer was also  discussed.  He was informed that current evidence does not provide any definitive answers regarding the risks of testosterone therapy on prostate cancer and cardiovascular disease. The need for periodic monitoring of his testosterone level, PSA, hematocrit and DRE was discussed.  He is undecided at this point if he desires TRT  We did discuss most insurance companies require 2 abnormal a.m. levels and a lab visit was scheduled for total testosterone, prolactin and [place], MD  [facility] [address], [place], [place] [address] [phone]

## 2019-12-18 ENCOUNTER — Other Ambulatory Visit: Payer: Self-pay | Admitting: *Deleted

## 2019-12-18 DIAGNOSIS — E291 Testicular hypofunction: Secondary | ICD-10-CM

## 2019-12-21 ENCOUNTER — Other Ambulatory Visit: Payer: No Typology Code available for payment source

## 2019-12-21 ENCOUNTER — Ambulatory Visit: Payer: No Typology Code available for payment source | Admitting: Urology

## 2019-12-21 ENCOUNTER — Other Ambulatory Visit: Payer: Self-pay

## 2019-12-21 DIAGNOSIS — E291 Testicular hypofunction: Secondary | ICD-10-CM

## 2019-12-22 LAB — LUTEINIZING HORMONE: LH: 3.8 m[IU]/mL (ref 1.7–8.6)

## 2019-12-22 LAB — PROLACTIN: Prolactin: 11.3 ng/mL (ref 4.0–15.2)

## 2019-12-22 LAB — TESTOSTERONE: Testosterone: 288 ng/dL (ref 264–916)

## 2019-12-23 ENCOUNTER — Telehealth: Payer: Self-pay | Admitting: *Deleted

## 2019-12-23 NOTE — Telephone Encounter (Signed)
-----   Message from Riki Altes, MD sent at 12/23/2019  8:16 AM EST ----- Repeat testosterone level was in the low normal range.  LH and prolactin were normal.  Insurance will not cover testosterone replacement unless he has 2 low levels.

## 2020-11-23 ENCOUNTER — Encounter: Payer: Self-pay | Admitting: Internal Medicine

## 2020-11-23 ENCOUNTER — Ambulatory Visit (INDEPENDENT_AMBULATORY_CARE_PROVIDER_SITE_OTHER): Payer: No Typology Code available for payment source | Admitting: Internal Medicine

## 2020-11-23 ENCOUNTER — Other Ambulatory Visit: Payer: Self-pay

## 2020-11-23 VITALS — BP 113/69 | HR 64 | Temp 97.2°F | Resp 17 | Ht 65.0 in | Wt 267.0 lb

## 2020-11-23 DIAGNOSIS — E1165 Type 2 diabetes mellitus with hyperglycemia: Secondary | ICD-10-CM | POA: Diagnosis not present

## 2020-11-23 DIAGNOSIS — G4730 Sleep apnea, unspecified: Secondary | ICD-10-CM

## 2020-11-23 DIAGNOSIS — Z0001 Encounter for general adult medical examination with abnormal findings: Secondary | ICD-10-CM

## 2020-11-23 DIAGNOSIS — E782 Mixed hyperlipidemia: Secondary | ICD-10-CM | POA: Diagnosis not present

## 2020-11-23 DIAGNOSIS — Z1159 Encounter for screening for other viral diseases: Secondary | ICD-10-CM

## 2020-11-23 DIAGNOSIS — Z1211 Encounter for screening for malignant neoplasm of colon: Secondary | ICD-10-CM

## 2020-11-23 DIAGNOSIS — Z23 Encounter for immunization: Secondary | ICD-10-CM

## 2020-11-23 MED ORDER — SIMVASTATIN 40 MG PO TABS: 40.0000 mg | ORAL_TABLET | Freq: Every day | ORAL | 3 refills | Status: DC

## 2020-11-23 NOTE — Progress Notes (Signed)
Subjective:    Patient ID: Carl Thornton, male    DOB: July 15, 1970, 50 y.o.   MRN: [id number]  HPI  Patient presents to clinic today for his annual exam.  He is also due to follow-up chronic conditions.  OSA: He is not wearing a CPAP.  There is no sleep study on file.  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  He is not taking any oral diabetic medication at this time.  He does not check his sugars.  He does not check his feet routinely.  His last eye exam was more than 1 year ago.  HLD: His last LDL was 166, triglycerides 164, 11/2019.  He  ran out of Simvastatin 1 month ago. He does not consume a low-fat diet.   Flu: 10/2020 Tetanus: 05/2019 Pneumovax: Never COVID: Pfizer x2 Shingrix: Never PSA screening: 11/2019 Colon screening: never Vision screening: as needed Dentist: biannually  Diet: He does eat meat. He consumes fruits and veggies. He does eat fried foods. He drinks water, soda. Exercise: None  Review of Systems     Past Medical History:  Diagnosis Date   History of prediabetes    Hypertension    not on medication    Current Outpatient Medications  Medication Sig Dispense Refill   blood glucose meter kit and supplies Dispense based on patient and insurance preference. Use up to four times daily as directed. (FOR ICD-10 E10.9, E11.9). 1 each 0   simvastatin (ZOCOR) 40 MG tablet Take 1 tablet (40 mg total) by mouth at bedtime. 90 tablet 3   tadalafil (CIALIS) 20 MG tablet Take 0.5-1 tablets (10-20 mg total) by mouth every other day as needed for erectile dysfunction. 10 tablet 11   No current facility-administered medications for this visit.    No Known Allergies  Family History  Problem Relation Age of Onset   Diabetes Mother    Asthma Father    Diabetes Sister    Heart attack Neg Hx    Stroke Neg Hx    Cancer Neg Hx     Social History   Socioeconomic History   Marital status: Married    Spouse name: Not on file   Number  of children: Not on file   Years of education: Not on file   Highest education level: Not on file  Occupational History   Not on file  Tobacco Use   Smoking status: Former    Types: Cigarettes   Smokeless tobacco: Never  Vaping Use   Vaping Use: Never used  Substance and Sexual Activity   Alcohol use: Yes    Comment: 12+ beverages    Drug use: No   Sexual activity: Yes  Other Topics Concern   Not on file  Social History Narrative   Not on file   Social Determinants of Health   Financial Resource Strain: Not on file  Food Insecurity: Not on file  Transportation Needs: Not on file  Physical Activity: Not on file  Stress: Not on file  Social Connections: Not on file  Intimate Partner Violence: Not on file     Constitutional: Denies fever, malaise, fatigue, headache or abrupt weight changes.  HEENT: Denies eye pain, eye redness, ear pain, ringing in the ears, wax buildup, runny nose, nasal congestion, bloody nose, or sore throat. Respiratory: Denies difficulty breathing, shortness of breath, cough or sputum production.   Cardiovascular: Denies chest pain, chest tightness, palpitations or swelling in the hands or feet.  Gastrointestinal: Denies  abdominal pain, bloating, constipation, diarrhea or blood in the stool.  GU: Denies urgency, frequency, pain with urination, burning sensation, blood in urine, odor or discharge. Musculoskeletal: Denies decrease in range of motion, difficulty with gait, muscle pain or joint pain and swelling.  Skin: Denies redness, rashes, lesions or ulcercations.  Neurological: Patient reports neuropathy.  Denies dizziness, difficulty with memory, difficulty with speech or problems with balance and coordination.  Psych: Denies anxiety, depression, SI/HI.  No other specific complaints in a complete review of systems (except as listed in HPI above).  Objective:   Physical Exam   BP 113/69 (BP Location: Right Arm, Patient Position: Sitting, Cuff  Size: Large)   Pulse 64   Temp (!) 97.2 F (36.2 C) (Temporal)   Resp 17   Ht 5' 5"  (1.651 m)   Wt 267 lb (121.1 kg)   SpO2 99%   BMI 44.43 kg/m   Wt Readings from Last 3 Encounters:  12/16/19 278 lb (126.1 kg)  11/26/19 278 lb 6.4 oz (126.3 kg)  11/23/19 283 lb 9.6 oz (128.6 kg)    General: Appears their stated age, obese, in NAD. Skin: Warm, dry and intact. No ulcerations noted. HEENT: Head: normal shape and size; Eyes: EOMs intact;  Neck:  Neck supple, trachea midline. No masses, lumps or thyromegaly present.  Cardiovascular: Normal rate and rhythm. S1,S2 noted.  No murmur, rubs or gallops noted. No JVD or BLE edema. No carotid bruits noted. Pulmonary/Chest: Normal effort and positive vesicular breath sounds. No respiratory distress. No wheezes, rales or ronchi noted.  Abdomen: Soft and nontender. Normal bowel sounds. No distention or masses noted. Liver, spleen and kidneys non palpable. Musculoskeletal: Strength 5/5 BUE/BLE. No difficulty with gait.  Neurological: Alert and oriented. Cranial nerves II-XII grossly intact. Coordination normal.  Psychiatric: Mood and affect normal. Behavior is normal. Judgment and thought content normal.     BMET    Component Value Date/Time   NA 136 11/23/2019 0940   NA 140 07/25/2011 0817   K 4.1 11/23/2019 0940   K 4.0 07/25/2011 0817   CL 104 11/23/2019 0940   CL 107 07/25/2011 0817   CO2 21 11/23/2019 0940   CO2 26 07/25/2011 0817   GLUCOSE 116 (H) 11/23/2019 0940   GLUCOSE 103 (H) 07/25/2011 0817   BUN 16 11/23/2019 0940   BUN 11 07/25/2011 0817   CREATININE 0.66 11/23/2019 0940   CALCIUM 9.0 11/23/2019 0940   CALCIUM 8.4 (L) 07/25/2011 0817   GFRNONAA 114 11/23/2019 0940   GFRAA 132 11/23/2019 0940    Lipid Panel     Component Value Date/Time   CHOL 228 (H) 11/23/2019 0940   TRIG 164 (H) 11/23/2019 0940   HDL 31 (L) 11/23/2019 0940   CHOLHDL 7.4 (H) 11/23/2019 0940   VLDL 33 (H) 10/03/2016 0921   LDLCALC 166 (H)  11/23/2019 0940    CBC    Component Value Date/Time   WBC 5.6 11/23/2019 0940   RBC 5.00 11/23/2019 0940   HGB 15.2 11/23/2019 0940   HCT 45.2 11/23/2019 0940   PLT 300 11/23/2019 0940   MCV 90.4 11/23/2019 0940   MCH 30.4 11/23/2019 0940   MCHC 33.6 11/23/2019 0940   RDW 13.8 11/23/2019 0940   LYMPHSABS 2,083 11/23/2019 0940   MONOABS 230 10/03/2016 0921   EOSABS 151 11/23/2019 0940   BASOSABS 62 11/23/2019 0940    Hgb A1C Lab Results  Component Value Date   HGBA1C 7.2 (H) 11/23/2019  Assessment & Plan:   Preventative Health Maintenance:  Flu shot UTD Tetanus UTD Pneumovax today Encouraged him to get his COVID booster Discussed Shingrix vaccine, he will check coverage with his insurance, he Referral to GI for screening colonoscopy Encouraged him to consume a balanced diet and exercise regimen Advised him to see an eye doctor and dentist annually We will check CBC, c-Met, lipid, A1c, and hep C today  RTC in 3 months for follow-up of chronic conditions Webb Silversmith, NP This visit occurred during the SARS-CoV-2 public health emergency.  Safety protocols were in place, including screening questions prior to the visit, additional usage of staff PPE, and extensive cleaning of exam room while observing appropriate contact time as indicated for disinfecting solutions.

## 2020-11-24 LAB — COMPLETE METABOLIC PANEL WITH GFR
AG Ratio: 1.5 (calc) (ref 1.0–2.5)
ALT: 22 U/L (ref 9–46)
AST: 20 U/L (ref 10–35)
Albumin: 4.1 g/dL (ref 3.6–5.1)
Alkaline phosphatase (APISO): 74 U/L (ref 35–144)
BUN/Creatinine Ratio: 29 (calc) — ABNORMAL HIGH (ref 6–22)
BUN: 17 mg/dL (ref 7–25)
CO2: 24 mmol/L (ref 20–32)
Calcium: 9 mg/dL (ref 8.6–10.3)
Chloride: 106 mmol/L (ref 98–110)
Creat: 0.59 mg/dL — ABNORMAL LOW (ref 0.70–1.30)
Globulin: 2.8 g/dL (calc) (ref 1.9–3.7)
Glucose, Bld: 100 mg/dL (ref 65–139)
Potassium: 4.1 mmol/L (ref 3.5–5.3)
Sodium: 137 mmol/L (ref 135–146)
Total Bilirubin: 1 mg/dL (ref 0.2–1.2)
Total Protein: 6.9 g/dL (ref 6.1–8.1)
eGFR: 118 mL/min/{1.73_m2} (ref >=60)

## 2020-11-24 LAB — HEPATITIS C ANTIBODY
Hepatitis C Ab: NONREACTIVE
SIGNAL TO CUT-OFF: 0.06 (ref <1.00)

## 2020-11-24 LAB — HEMOGLOBIN A1C
Hgb A1c MFr Bld: 6.1 % of total Hgb — ABNORMAL HIGH (ref <5.7)
Mean Plasma Glucose: 128 mg/dL
eAG (mmol/L): 7.1 mmol/L

## 2020-11-24 LAB — CBC
HCT: 46.2 % (ref 38.5–50.0)
Hemoglobin: 15.3 g/dL (ref 13.2–17.1)
MCH: 30.1 pg (ref 27.0–33.0)
MCHC: 33.1 g/dL (ref 32.0–36.0)
MCV: 90.8 fL (ref 80.0–100.0)
MPV: 10.4 fL (ref 7.5–12.5)
Platelets: 282 10*3/uL (ref 140–400)
RBC: 5.09 10*6/uL (ref 4.20–5.80)
RDW: 13.5 % (ref 11.0–15.0)
WBC: 5.2 10*3/uL (ref 3.8–10.8)

## 2020-11-24 LAB — LIPID PANEL
Cholesterol: 233 mg/dL — ABNORMAL HIGH (ref <200)
HDL: 31 mg/dL — ABNORMAL LOW (ref >=40)
LDL Cholesterol (Calc): 169 mg/dL (calc) — ABNORMAL HIGH
Non-HDL Cholesterol (Calc): 202 mg/dL (calc) — ABNORMAL HIGH (ref <130)
Total CHOL/HDL Ratio: 7.5 (calc) — ABNORMAL HIGH (ref <5.0)
Triglycerides: 173 mg/dL — ABNORMAL HIGH (ref <150)

## 2020-11-24 LAB — TSH: TSH: 1.27 mIU/L (ref 0.40–4.50)

## 2020-11-24 NOTE — Assessment & Plan Note (Signed)
A1C today Encouraged him to consume a low carb diet and exercise for weight loss No medications Encouraged him to schedule an appt for an eye exam Encouraged routine foot exams Flu shot UTD Encouraged him to get his Covid booster Pneumovax today

## 2020-11-24 NOTE — Assessment & Plan Note (Signed)
CMET and lipid profile today He needs to restart Simvastatin, refilled today Encouraged low fat diet

## 2020-11-24 NOTE — Patient Instructions (Signed)
Health Maintenance, Male Adopting a healthy lifestyle and getting preventive care are important in promoting health and wellness. Ask your health care provider about: The right schedule for you to have regular tests and exams. Things you can do on your own to prevent diseases and keep yourself healthy. What should I know about diet, weight, and exercise? Eat a healthy diet  Eat a diet that includes plenty of vegetables, fruits, low-fat dairy products, and lean protein. Do not eat a lot of foods that are high in solid fats, added sugars, or sodium. Maintain a healthy weight Body mass index (BMI) is a measurement that can be used to identify possible weight problems. It estimates body fat based on height and weight. Your health care provider can help determine your BMI and help you achieve or maintain a healthy weight. Get regular exercise Get regular exercise. This is one of the most important things you can do for your health. Most adults should: Exercise for at least 150 minutes each week. The exercise should increase your heart rate and make you sweat (moderate-intensity exercise). Do strengthening exercises at least twice a week. This is in addition to the moderate-intensity exercise. Spend less time sitting. Even light physical activity can be beneficial. Watch cholesterol and blood lipids Have your blood tested for lipids and cholesterol at 50 years of age, then have this test every 5 years. You may need to have your cholesterol levels checked more often if: Your lipid or cholesterol levels are high. You are older than 50 years of age. You are at high risk for heart disease. What should I know about cancer screening? Many types of cancers can be detected early and may often be prevented. Depending on your health history and family history, you may need to have cancer screening at various ages. This may include screening for: Colorectal cancer. Prostate cancer. Skin cancer. Lung  cancer. What should I know about heart disease, diabetes, and high blood pressure? Blood pressure and heart disease High blood pressure causes heart disease and increases the risk of stroke. This is more likely to develop in people who have high blood pressure readings, are of African descent, or are overweight. Talk with your health care provider about your target blood pressure readings. Have your blood pressure checked: Every 3-5 years if you are 18-39 years of age. Every year if you are 40 years old or older. If you are between the ages of 65 and 75 and are a current or former smoker, ask your health care provider if you should have a one-time screening for abdominal aortic aneurysm (AAA). Diabetes Have regular diabetes screenings. This checks your fasting blood sugar level. Have the screening done: Once every three years after age 45 if you are at a normal weight and have a low risk for diabetes. More often and at a younger age if you are overweight or have a high risk for diabetes. What should I know about preventing infection? Hepatitis B If you have a higher risk for hepatitis B, you should be screened for this virus. Talk with your health care provider to find out if you are at risk for hepatitis B infection. Hepatitis C Blood testing is recommended for: Everyone born from 1945 through 1965. Anyone with known risk factors for hepatitis C. Sexually transmitted infections (STIs) You should be screened each year for STIs, including gonorrhea and chlamydia, if: You are sexually active and are younger than 50 years of age. You are older than 50 years   of age and your health care provider tells you that you are at risk for this type of infection. Your sexual activity has changed since you were last screened, and you are at increased risk for chlamydia or gonorrhea. Ask your health care provider if you are at risk. Ask your health care provider about whether you are at high risk for HIV.  Your health care provider may recommend a prescription medicine to help prevent HIV infection. If you choose to take medicine to prevent HIV, you should first get tested for HIV. You should then be tested every 3 months for as long as you are taking the medicine. Follow these instructions at home: Lifestyle Do not use any products that contain nicotine or tobacco, such as cigarettes, e-cigarettes, and chewing tobacco. If you need help quitting, ask your health care provider. Do not use street drugs. Do not share needles. Ask your health care provider for help if you need support or information about quitting drugs. Alcohol use Do not drink alcohol if your health care provider tells you not to drink. If you drink alcohol: Limit how much you have to 0-2 drinks a day. Be aware of how much alcohol is in your drink. In the [place] one drink equals one 12 oz bottle of beer (355 mL), one 5 oz glass of wine (148 mL), or one 1 oz glass of hard liquor (44 mL). General instructions Schedule regular health, dental, and eye exams. Stay current with your vaccines. Tell your health care provider if: You often feel depressed. You have ever been abused or do not feel safe at home. Summary Adopting a healthy lifestyle and getting preventive care are important in promoting health and wellness. Follow your health care provider's instructions about healthy diet, exercising, and getting tested or screened for diseases. Follow your health care provider's instructions on monitoring your cholesterol and blood pressure. This information is not intended to replace advice given to you by your health care provider. Make sure you discuss any questions you have with your health care provider. Document Revised: 04/01/2020 Document Reviewed: 01/15/2018 Elsevier Patient Education  2022 Elsevier Inc.  

## 2020-11-24 NOTE — Assessment & Plan Note (Signed)
He reports he does not have sleep apnea Encouraged weight loss as this can help reduce sleep apnea symptoms

## 2020-11-24 NOTE — Assessment & Plan Note (Signed)
Encouraged diet and exercise for weight loss ?

## 2021-02-09 DIAGNOSIS — J069 Acute upper respiratory infection, unspecified: Secondary | ICD-10-CM | POA: Diagnosis not present

## 2021-02-09 DIAGNOSIS — R051 Acute cough: Secondary | ICD-10-CM | POA: Diagnosis not present

## 2021-02-13 ENCOUNTER — Other Ambulatory Visit: Payer: Self-pay

## 2021-02-13 ENCOUNTER — Encounter: Payer: Self-pay | Admitting: Urology

## 2021-02-13 ENCOUNTER — Ambulatory Visit (INDEPENDENT_AMBULATORY_CARE_PROVIDER_SITE_OTHER): Payer: BC Managed Care – PPO | Admitting: Urology

## 2021-02-13 VITALS — BP 132/79 | HR 85 | Ht 65.0 in | Wt 260.0 lb

## 2021-02-13 DIAGNOSIS — N5201 Erectile dysfunction due to arterial insufficiency: Secondary | ICD-10-CM | POA: Diagnosis not present

## 2021-02-13 MED ORDER — TADALAFIL 20 MG PO TABS: 20.0000 mg | ORAL_TABLET | Freq: Every day | ORAL | 6 refills | Status: DC | PRN

## 2021-02-13 NOTE — Progress Notes (Signed)
° °  02/13/2021 2:00 PM   [place] Shawnn Bouillon 1970/09/08 [id number]  Referring provider: Jearld Fenton, NP [address] [place],  [place] [address]  Chief Complaint  Patient presents with   Medication Refill    HPI: 51 y.o. male presents for medication refill.  Initially seen 12/2019 for ED Also had a low testosterone level but no symptoms of tiredness, fatigue or decreased libido Testosterone level was repeated and was low normal States he has good energy and libido and is not interested in TRT He was started on tadalafil 20 mg as needed for ED which has been effective and presents for a refill   PMH: Past Medical History:  Diagnosis Date   History of prediabetes    Hypertension    not on medication    Surgical History: Past Surgical History:  Procedure Laterality Date   ANKLE SURGERY     HERNIA REPAIR     umbilical    Home Medications:  Allergies as of 02/13/2021   No Known Allergies      Medication List        Accurate as of February 13, 2021  2:00 PM. If you have any questions, ask your nurse or doctor.          blood glucose meter kit and supplies Dispense based on patient and insurance preference. Use up to four times daily as directed. (FOR ICD-10 E10.9, E11.9).   simvastatin 40 MG tablet Commonly known as: ZOCOR Take 1 tablet (40 mg total) by mouth at bedtime.        Allergies: No Known Allergies  Family History: Family History  Problem Relation Age of Onset   Diabetes Mother    Asthma Father    Diabetes Sister    Heart attack Neg Hx    Stroke Neg Hx    Cancer Neg Hx     Social History:  reports that he has quit smoking. His smoking use included cigarettes. He has never used smokeless tobacco. He reports current alcohol use. He reports that he does not use drugs.   Physical Exam: BP 132/79    Pulse 85    Ht _0  (1.651 m)    Wt 260 lb (117.9 kg)    BMI 43.27 kg/m   Constitutional:  Alert and oriented, No acute  distress. HEENT: [place] AT, moist mucus membranes.  Trachea midline, no masses. Cardiovascular: No clubbing, cyanosis, or edema. Respiratory: Normal respiratory effort, no increased work of breathing. Psychiatric: Normal mood and affect.   Assessment & Plan:    1.  Erectile dysfunction Good efficacy with tadalafil Refill sent to pharmacy Annual follow-up   Abbie Sons, [facility] [address], [place], [place] [address] [phone]

## 2021-02-22 ENCOUNTER — Other Ambulatory Visit: Payer: Self-pay

## 2021-02-22 ENCOUNTER — Encounter: Payer: Self-pay | Admitting: Internal Medicine

## 2021-02-22 ENCOUNTER — Ambulatory Visit (INDEPENDENT_AMBULATORY_CARE_PROVIDER_SITE_OTHER): Payer: BC Managed Care – PPO | Admitting: Internal Medicine

## 2021-02-22 VITALS — BP 115/69 | HR 64 | Ht 65.0 in | Wt 268.0 lb

## 2021-02-22 DIAGNOSIS — E782 Mixed hyperlipidemia: Secondary | ICD-10-CM | POA: Diagnosis not present

## 2021-02-22 DIAGNOSIS — E1165 Type 2 diabetes mellitus with hyperglycemia: Secondary | ICD-10-CM | POA: Diagnosis not present

## 2021-02-22 NOTE — Assessment & Plan Note (Signed)
A1c and urine microalbumin today °Encouraged him to consume a low-carb diet and exercise for weight loss °

## 2021-02-22 NOTE — Progress Notes (Signed)
Subjective:    Patient ID: [place] Thornton, male    DOB: 1970/12/04, 51 y.o.   MRN: [id number]  HPI  Patient presents to clinic today for 39-monthfollow-up of DM2, HLD.  His last LDL was 169, triglycerides 173, A1c 6.1%, 11/2020.  He had been taking Simvastatin when he remembers.  He does not consume a low-fat diet.  He is not currently taking any oral diabetic medication at this time.  He does not check his sugars.  He also reports pain in his right lower back.  He reports he bent down to pick something up the other day and felt like he strained the muscle.  He describes the pain as sore and achy.  The pain does not radiate.  He denies numbness, tingling or weakness of his right lower extremity.  He denies loss of bowel or bladder control.  He has not taken anything OTC for this.  He did go to the gym and tried to use a mProduction assistant, radiowhich did seem to help.  Review of Systems     Past Medical History:  Diagnosis Date   History of prediabetes    Hypertension    not on medication    Current Outpatient Medications  Medication Sig Dispense Refill   albuterol (VENTOLIN HFA) 108 (90 Base) MCG/ACT inhaler Inhale 2 puffs into the lungs every 6 (six) hours as needed.     blood glucose meter kit and supplies Dispense based on patient and insurance preference. Use up to four times daily as directed. (FOR ICD-10 E10.9, E11.9). 1 each 0   simvastatin (ZOCOR) 40 MG tablet Take 1 tablet (40 mg total) by mouth at bedtime. 90 tablet 3   tadalafil (CIALIS) 20 MG tablet Take 1 tablet (20 mg total) by mouth daily as needed for erectile dysfunction. 30 tablet 6   No current facility-administered medications for this visit.    No Known Allergies  Family History  Problem Relation Age of Onset   Diabetes Mother    Asthma Father    Diabetes Sister    Heart attack Neg Hx    Stroke Neg Hx    Cancer Neg Hx     Social History   Socioeconomic History   Marital status: Married    Spouse name:  Not on file   Number of children: Not on file   Years of education: Not on file   Highest education level: Not on file  Occupational History   Not on file  Tobacco Use   Smoking status: Former    Types: Cigarettes   Smokeless tobacco: Never  Vaping Use   Vaping Use: Never used  Substance and Sexual Activity   Alcohol use: Yes    Comment: 12+ beverages    Drug use: No   Sexual activity: Yes  Other Topics Concern   Not on file  Social History Narrative   Not on file   Social Determinants of Health   Financial Resource Strain: Not on file  Food Insecurity: Not on file  Transportation Needs: Not on file  Physical Activity: Not on file  Stress: Not on file  Social Connections: Not on file  Intimate Partner Violence: Not on file     Constitutional: Denies fever, malaise, fatigue, headache or abrupt weight changes.  Respiratory: Denies difficulty breathing, shortness of breath, cough or sputum production.   Cardiovascular: Denies chest pain, chest tightness, palpitations or swelling in the hands or feet.  Gastrointestinal: Denies abdominal pain, bloating, constipation, diarrhea  or blood in the stool.  GU: Denies urgency, frequency, pain with urination, burning sensation, blood in urine, odor or discharge. Musculoskeletal: Patient reports right low back pain.  Denies decrease in range of motion, difficulty with gait,  or joint pain and swelling.  Skin: Denies redness, rashes, lesions or ulcercations.  Neurological: Denies dizziness, difficulty with memory, difficulty with speech or problems with balance and coordination.    No other specific complaints in a complete review of systems (except as listed in HPI above).  Objective:   Physical Exam  BP 115/69 (BP Location: Right Arm, Patient Position: Sitting, Cuff Size: Large)    Pulse 64    Ht 5' 5"  (1.651 m)    Wt 268 lb (121.6 kg)    SpO2 99%    BMI 44.60 kg/m   Wt Readings from Last 3 Encounters:  02/13/21 260 lb (117.9  kg)  11/23/20 267 lb (121.1 kg)  12/16/19 278 lb (126.1 kg)    General: Appears his stated age, obese, in NAD. Skin: Warm, dry and intact. No ulcerations noted. HEENT: Head: normal shape and size; Eyes: sclera white and EOMs intact;  Cardiovascular: Normal rate and rhythm. S1,S2 noted.  No murmur, rubs or gallops noted. No JVD or BLE edema. No carotid bruits noted. Pulmonary/Chest: Normal effort and positive vesicular breath sounds. No respiratory distress. No wheezes, rales or ronchi noted.  Musculoskeletal: Normal flexion, extension and rotation of the spine.  No bony tenderness noted over the lumbar spine.  Pain with palpation of the right paralumbar muscles.  No difficulty with gait. Neurological: Alert and oriented.  BMET    Component Value Date/Time   NA 137 11/23/2020 0955   NA 140 07/25/2011 0817   K 4.1 11/23/2020 0955   K 4.0 07/25/2011 0817   CL 106 11/23/2020 0955   CL 107 07/25/2011 0817   CO2 24 11/23/2020 0955   CO2 26 07/25/2011 0817   GLUCOSE 100 11/23/2020 0955   GLUCOSE 103 (H) 07/25/2011 0817   BUN 17 11/23/2020 0955   BUN 11 07/25/2011 0817   CREATININE 0.59 (L) 11/23/2020 0955   CALCIUM 9.0 11/23/2020 0955   CALCIUM 8.4 (L) 07/25/2011 0817   GFRNONAA 114 11/23/2019 0940   GFRAA 132 11/23/2019 0940    Lipid Panel     Component Value Date/Time   CHOL 233 (H) 11/23/2020 0955   TRIG 173 (H) 11/23/2020 0955   HDL 31 (L) 11/23/2020 0955   CHOLHDL 7.5 (H) 11/23/2020 0955   VLDL 33 (H) 10/03/2016 0921   LDLCALC 169 (H) 11/23/2020 0955    CBC    Component Value Date/Time   WBC 5.2 11/23/2020 0955   RBC 5.09 11/23/2020 0955   HGB 15.3 11/23/2020 0955   HCT 46.2 11/23/2020 0955   PLT 282 11/23/2020 0955   MCV 90.8 11/23/2020 0955   MCH 30.1 11/23/2020 0955   MCHC 33.1 11/23/2020 0955   RDW 13.5 11/23/2020 0955   LYMPHSABS 2,083 11/23/2019 0940   MONOABS 230 10/03/2016 0921   EOSABS 151 11/23/2019 0940   BASOSABS 62 11/23/2019 0940    Hgb  A1C Lab Results  Component Value Date   HGBA1C 6.1 (H) 11/23/2020          Assessment & Plan:   Right Side Low Back Pain:  He appears to be bending at the waist when he picks something up Advised him to start bending at the knees and keep his back straight Encourage stretching Heat massage may be helpful  We will follow-up after labs with further recommendation and treatment plan   Webb Silversmith, NP This visit occurred during the SARS-CoV-2 public health emergency.  Safety protocols were in place, including screening questions prior to the visit, additional usage of staff PPE, and extensive cleaning of exam room while observing appropriate contact time as indicated for disinfecting solutions.

## 2021-02-22 NOTE — Assessment & Plan Note (Signed)
C-Met and lipid profile today Encouraged him to consume a low-fat diet Consider changing Simvastatin to Atorvastatin

## 2021-02-22 NOTE — Assessment & Plan Note (Signed)
Encouraged diet and exercise for weight loss ?

## 2021-02-22 NOTE — Patient Instructions (Signed)
Heart-Healthy Eating Plan Heart-healthy meal planning includes: Eating less unhealthy fats. Eating more healthy fats. Making other changes in your diet. Talk with your doctor or a diet specialist (dietitian) to create an eating plan that is right for you. What is my plan? Your doctor may recommend an eating plan that includes: Total fat: ______% or less of total calories a day. Saturated fat: ______% or less of total calories a day. Cholesterol: less than _________mg a day. What are tips for following this plan? Cooking Avoid frying your food. Try to bake, boil, grill, or broil it instead. You can also reduce fat by: Removing the skin from poultry. Removing all visible fats from meats. Steaming vegetables in water or broth. Meal planning  At meals, divide your plate into four equal parts: Fill one-half of your plate with vegetables and green salads. Fill one-fourth of your plate with whole grains. Fill one-fourth of your plate with lean protein foods. Eat 4-5 servings of vegetables per day. A serving of vegetables is: 1 cup of raw or cooked vegetables. 2 cups of raw leafy greens. Eat 4-5 servings of fruit per day. A serving of fruit is: 1 medium whole fruit.  cup of dried fruit.  cup of fresh, frozen, or canned fruit.  cup of 100% fruit juice. Eat more foods that have soluble fiber. These are apples, broccoli, carrots, beans, peas, and barley. Try to get 20-30 g of fiber per day. Eat 4-5 servings of nuts, legumes, and seeds per week: 1 serving of dried beans or legumes equals  cup after being cooked. 1 serving of nuts is  cup. 1 serving of seeds equals 1 tablespoon. General information Eat more home-cooked food. Eat less restaurant, buffet, and fast food. Limit or avoid alcohol. Limit foods that are high in starch and sugar. Avoid fried foods. Lose weight if you are overweight. Keep track of how much salt (sodium) you eat. This is important if you have high blood  pressure. Ask your doctor to tell you more about this. Try to add vegetarian meals each week. Fats Choose healthy fats. These include olive oil and canola oil, flaxseeds, walnuts, almonds, and seeds. Eat more omega-3 fats. These include salmon, mackerel, sardines, tuna, flaxseed oil, and ground flaxseeds. Try to eat fish at least 2 times each week. Check food labels. Avoid foods with trans fats or high amounts of saturated fat. Limit saturated fats. These are often found in animal products, such as meats, butter, and cream. These are also found in plant foods, such as palm oil, palm kernel oil, and coconut oil. Avoid foods with partially hydrogenated oils in them. These have trans fats. Examples are stick margarine, some tub margarines, cookies, crackers, and other baked goods. What foods can I eat? Fruits All fresh, canned (in natural juice), or frozen fruits. Vegetables Fresh or frozen vegetables (raw, steamed, roasted, or grilled). Green salads. Grains Most grains. Choose whole wheat and whole grains most of the time. Rice and pasta, including brown rice and pastas made with whole wheat. Meats and other proteins Lean, well-trimmed beef, veal, pork, and lamb. Chicken and turkey without skin. All fish and shellfish. Wild duck, rabbit, pheasant, and venison. Egg whites or low-cholesterol egg substitutes. Dried beans, peas, lentils, and tofu. Seeds and most nuts. Dairy Low-fat or nonfat cheeses, including ricotta and mozzarella. Skim or 1% milk that is liquid, powdered, or evaporated. Buttermilk that is made with low-fat milk. Nonfat or low-fat yogurt. Fats and oils Non-hydrogenated (trans-free) margarines. Vegetable oils, including   soybean, sesame, sunflower, olive, peanut, safflower, corn, canola, and cottonseed. Salad dressings or mayonnaise made with a vegetable oil. Beverages Mineral water. Coffee and tea. Diet carbonated beverages. Sweets and desserts Sherbet, gelatin, and fruit ice.  Small amounts of dark chocolate. Limit all sweets and desserts. Seasonings and condiments All seasonings and condiments. The items listed above may not be a complete list of foods and drinks you can eat. Contact a dietitian for more options. What foods should I avoid? Fruits Canned fruit in heavy syrup. Fruit in cream or butter sauce. Fried fruit. Limit coconut. Vegetables Vegetables cooked in cheese, cream, or butter sauce. Fried vegetables. Grains Breads that are made with saturated or trans fats, oils, or whole milk. Croissants. Sweet rolls. Donuts. High-fat crackers, such as cheese crackers. Meats and other proteins Fatty meats, such as hot dogs, ribs, sausage, bacon, rib-eye roast or steak. High-fat deli meats, such as salami and bologna. Caviar. Domestic duck and goose. Organ meats, such as liver. Dairy Cream, sour cream, cream cheese, and creamed cottage cheese. Whole-milk cheeses. Whole or 2% milk that is liquid, evaporated, or condensed. Whole buttermilk. Cream sauce or high-fat cheese sauce. Yogurt that is made from whole milk. Fats and oils Meat fat, or shortening. Cocoa butter, hydrogenated oils, palm oil, coconut oil, palm kernel oil. Solid fats and shortenings, including bacon fat, salt pork, lard, and butter. Nondairy cream substitutes. Salad dressings with cheese or sour cream. Beverages Regular sodas and juice drinks with added sugar. Sweets and desserts Frosting. Pudding. Cookies. Cakes. Pies. Milk chocolate or white chocolate. Buttered syrups. Full-fat ice cream or ice cream drinks. The items listed above may not be a complete list of foods and drinks to avoid. Contact a dietitian for more information. Summary Heart-healthy meal planning includes eating less unhealthy fats, eating more healthy fats, and making other changes in your diet. Eat a balanced diet. This includes fruits and vegetables, low-fat or nonfat dairy, lean protein, nuts and legumes, whole grains, and  heart-healthy oils and fats. This information is not intended to replace advice given to you by your health care provider. Make sure you discuss any questions you have with your health care provider. Document Revised: 06/02/2020 Document Reviewed: 06/02/2020 Elsevier Patient Education  2022 Elsevier Inc.  

## 2021-02-23 LAB — MICROALBUMIN / CREATININE URINE RATIO
Creatinine, Urine: 118 mg/dL (ref 20–320)
Microalb Creat Ratio: 6 mcg/mg creat (ref <30)
Microalb, Ur: 0.7 mg/dL

## 2021-02-23 LAB — LIPID PANEL
Cholesterol: 201 mg/dL — ABNORMAL HIGH (ref <200)
HDL: 33 mg/dL — ABNORMAL LOW (ref >=40)
LDL Cholesterol (Calc): 119 mg/dL (calc) — ABNORMAL HIGH
Non-HDL Cholesterol (Calc): 168 mg/dL (calc) — ABNORMAL HIGH (ref <130)
Total CHOL/HDL Ratio: 6.1 (calc) — ABNORMAL HIGH (ref <5.0)
Triglycerides: 342 mg/dL — ABNORMAL HIGH (ref <150)

## 2021-02-23 LAB — HEMOGLOBIN A1C
Hgb A1c MFr Bld: 6.7 % of total Hgb — ABNORMAL HIGH (ref <5.7)
Mean Plasma Glucose: 146 mg/dL
eAG (mmol/L): 8.1 mmol/L

## 2021-12-20 DIAGNOSIS — M170 Bilateral primary osteoarthritis of knee: Secondary | ICD-10-CM | POA: Diagnosis not present

## 2022-02-14 ENCOUNTER — Ambulatory Visit: Payer: BC Managed Care – PPO | Admitting: Urology

## 2022-02-15 ENCOUNTER — Encounter: Payer: Self-pay | Admitting: Urology

## 2022-03-08 ENCOUNTER — Ambulatory Visit (INDEPENDENT_AMBULATORY_CARE_PROVIDER_SITE_OTHER): Payer: BC Managed Care – PPO | Admitting: Family Medicine

## 2022-03-08 ENCOUNTER — Encounter: Payer: Self-pay | Admitting: Family Medicine

## 2022-03-08 VITALS — BP 126/78 | HR 85 | Ht 66.0 in | Wt 278.0 lb

## 2022-03-08 DIAGNOSIS — J069 Acute upper respiratory infection, unspecified: Secondary | ICD-10-CM | POA: Diagnosis not present

## 2022-03-08 DIAGNOSIS — R051 Acute cough: Secondary | ICD-10-CM

## 2022-03-08 DIAGNOSIS — J019 Acute sinusitis, unspecified: Secondary | ICD-10-CM | POA: Diagnosis not present

## 2022-03-08 LAB — POC INFLUENZA A&B (BINAX/QUICKVUE)
Influenza A, POC: NEGATIVE
Influenza B, POC: NEGATIVE

## 2022-03-08 LAB — POC COVID19 BINAXNOW: SARS Coronavirus 2 Ag: NEGATIVE

## 2022-03-08 MED ORDER — FLUTICASONE PROPIONATE 50 MCG/ACT NA SUSP: 2.0000 | Freq: Every day | NASAL | 0 refills | Status: DC

## 2022-03-08 NOTE — Patient Instructions (Addendum)
Thank you for coming to the office today.  1. It sounds like you have persistent Sinus Congestion or "Rhinosinusitis" - I do not think that this is a Bacterial Sinus Infection. Usually these are caused by Viruses or Allergies, and will run it's course in about 7 to 10 days. - No antibiotics are needed  Start nasal steroid Flonase 2 sprays in each nostril daily for 4-6 weeks, may repeat course seasonally or as needed  Keep on OTC NyQuil DayQuil if needed  Use albuterol inhaler if worse wheezing cough at night  - Improve hydration by drinking plenty of clear fluids (water, gatorade) to reduce secretions and thin congestion - Congestion draining down throat can cause irritation. May try warm herbal tea with honey, cough drops - Can take Tylenol or Ibuprofen as needed for fevers - May continue over the counter cold medicine as you are, I would not use any decongestant or mucinex longer than 7 days.   If you develop persistent fever >101F for at least 3 consecutive days, headaches with sinus pain or pressure or persistent earache, please schedule a follow-up evaluation within next few days to week.   Please schedule a Follow-up Appointment to: Return if symptoms worsen or fail to improve.  If you have any other questions or concerns, please feel free to call the office or send a message through [facility]. You may also schedule an earlier appointment if necessary.  Additionally, you may be receiving a survey about your experience at our office within a few days to 1 week by e-mail or mail. We value your feedback.  Nobie Putnam, DO [place]

## 2022-03-08 NOTE — Progress Notes (Signed)
Subjective:    Patient ID: Carl Thornton, male    DOB: 1970-12-28, 52 y.o.   MRN: [id number]  Carl Thornton is a 52 y.o. male presenting on 03/08/2022 for Cough  Patient presents for a same day appointment.  PCP Webb Silversmith, FNP    HPI  Coughing / URI Reports worsening symptoms 3 days with persistent cough. Admits sinus congestion with symptoms No sick contacts. He takes DayQuil / NyQuil, wife had similar cough recently. She is improved. Today his cough is improved. But he still has significant sinus congestion symptoms.  He missed work this week, needs note      02/22/2021    9:59 AM 11/23/2020    9:46 AM 11/23/2019    9:06 AM  Depression screen PHQ 2/9  Decreased Interest 0 0 0  Down, Depressed, Hopeless 0 0 0  PHQ - 2 Score 0 0 0  Altered sleeping 0 1   Tired, decreased energy 0 1   Change in appetite 0 1   Feeling bad or failure about yourself  0 0   Trouble concentrating 1 1   Moving slowly or fidgety/restless 0 0   Suicidal thoughts 0 0   PHQ-9 Score 1 4   Difficult doing work/chores Not difficult at all Not difficult at all     Social History   Tobacco Use   Smoking status: Former    Types: Cigarettes   Smokeless tobacco: Never  Vaping Use   Vaping Use: Never used  Substance Use Topics   Alcohol use: Yes    Comment: 12+ beverages    Drug use: No    Review of Systems Per HPI unless specifically indicated above     Objective:    BP 126/78   Pulse 85   Ht 5\' 6"  (1.676 m)   Wt 278 lb (126.1 kg)   SpO2 98%   BMI 44.87 kg/m   Wt Readings from Last 3 Encounters:  03/08/22 278 lb (126.1 kg)  02/22/21 268 lb (121.6 kg)  02/13/21 260 lb (117.9 kg)    Physical Exam Vitals and nursing note reviewed.  Constitutional:      General: He is not in acute distress.    Appearance: He is well-developed. He is not diaphoretic.     Comments: Well-appearing, comfortable, cooperative  HENT:     Head: Normocephalic and atraumatic.      Right Ear: Tympanic membrane, ear canal and external ear normal. There is no impacted cerumen.     Left Ear: Tympanic membrane, ear canal and external ear normal. There is no impacted cerumen.     Nose: Congestion present.  Eyes:     General:        Right eye: No discharge.        Left eye: No discharge.     Conjunctiva/sclera: Conjunctivae normal.  Neck:     Thyroid: No thyromegaly.  Cardiovascular:     Rate and Rhythm: Normal rate and regular rhythm.     Pulses: Normal pulses.     Heart sounds: Normal heart sounds. No murmur heard. Pulmonary:     Effort: Pulmonary effort is normal. No respiratory distress.     Breath sounds: Normal breath sounds. No wheezing or rales.  Musculoskeletal:        General: Normal range of motion.     Cervical back: Normal range of motion and neck supple.  Lymphadenopathy:     Cervical: No cervical adenopathy.  Skin:  General: Skin is warm and dry.     Findings: No erythema or rash.  Neurological:     Mental Status: He is alert and oriented to person, place, and time. Mental status is at baseline.  Psychiatric:        Behavior: Behavior normal.     Comments: Well groomed, good eye contact, normal speech and thoughts    Results for orders placed or performed in visit on 03/08/22  POC Influenza A&B (Binax test)  Result Value Ref Range   Influenza A, POC Negative Negative   Influenza B, POC Negative Negative  POC COVID-19  Result Value Ref Range   SARS Coronavirus 2 Ag Negative Negative      Assessment & Plan:   Problem List Items Addressed This Visit   None Visit Diagnoses     Viral URI with cough    -  Primary   Relevant Medications   fluticasone (FLONASE) 50 MCG/ACT nasal spray   Acute cough       Relevant Orders   POC Influenza A&B (Binax test) (Completed)   POC COVID-19 (Completed)   Acute rhinosinusitis       Relevant Medications   fluticasone (FLONASE) 50 MCG/ACT nasal spray       Consistent with acute frontal  sinusitis, likely initially viral URI Negative COVID Flu tests today Afebrile Persistent sinus symptoms No focal sign of bacterial infection  Plan: 1. Reassurance, likely self-limited - no indication for antibiotics at this time 2. Start nasal steroid Flonase 2 sprays in each nostril daily for 4-6 weeks, may repeat course seasonally or as needed 3. OTC supportive meds as needed, already has albuterol AS NEEDED Return criteria reviewed Work note   Meds ordered this encounter  Medications   fluticasone (FLONASE) 50 MCG/ACT nasal spray    Sig: Place 2 sprays into both nostrils daily. Use for 4-6 weeks then stop and use seasonally or as needed.    Dispense:  16 g    Refill:  0      Follow up plan: Return if symptoms worsen or fail to improve.   Nobie Putnam, DO [facility] 03/08/2022, 11:47 AM

## 2022-03-21 ENCOUNTER — Other Ambulatory Visit: Payer: Self-pay | Admitting: Family Medicine

## 2022-03-21 DIAGNOSIS — J019 Acute sinusitis, unspecified: Secondary | ICD-10-CM

## 2022-03-21 DIAGNOSIS — J069 Acute upper respiratory infection, unspecified: Secondary | ICD-10-CM

## 2022-03-22 NOTE — Telephone Encounter (Signed)
Unable to refill per protocol, Rx request is too soon. Last refill 03/08/22 for 16 g.  Requested Prescriptions  Pending Prescriptions Disp Refills   fluticasone (FLONASE) 50 MCG/ACT nasal spray [Pharmacy Med Name: FLUTICASONE PROP 50 MCG SPRAY] 48 mL 1    Sig: PLACE 2 SPRAYS INTO BOTH NOSTRILS DAILY. USE FOR 4-6 WEEKS THEN STOP AND USE SEASONALLY OR AS NEEDED.     Ear, Nose, and Throat: Nasal Preparations - Corticosteroids Passed - 03/21/2022  8:30 PM      Passed - Valid encounter within last 12 months    Recent Outpatient Visits           2 weeks ago Viral URI with cough   [facility] Olin Hauser, DO   1 year ago Mixed hyperlipidemia   [facility] [place], Coralie Keens, NP   1 year ago Encounter for general adult medical examination with abnormal findings   [facility] [place], Coralie Keens, NP   2 years ago Type 2 diabetes mellitus with hyperglycemia, without long-term current use of insulin [facility])   [facility] [place], Lupita Raider, FNP   2 years ago Annual physical exam   [facility] [place], Lupita Raider, [place]

## 2022-05-10 ENCOUNTER — Ambulatory Visit (INDEPENDENT_AMBULATORY_CARE_PROVIDER_SITE_OTHER): Payer: BC Managed Care – PPO | Admitting: Family Medicine

## 2022-05-10 ENCOUNTER — Encounter: Payer: Self-pay | Admitting: Family Medicine

## 2022-05-10 VITALS — BP 126/68 | HR 90 | Temp 96.4°F | Wt 275.0 lb

## 2022-05-10 DIAGNOSIS — J101 Influenza due to other identified influenza virus with other respiratory manifestations: Secondary | ICD-10-CM

## 2022-05-10 MED ORDER — OSELTAMIVIR PHOSPHATE 75 MG PO CAPS: 75.0000 mg | ORAL_CAPSULE | Freq: Two times a day (BID) | ORAL | 0 refills | Status: DC

## 2022-05-10 MED ORDER — BENZONATATE 100 MG PO CAPS
100.0000 mg | ORAL_CAPSULE | Freq: Three times a day (TID) | ORAL | 0 refills | Status: DC | PRN
Start: 2022-05-10 — End: 2022-05-30

## 2022-05-10 MED ORDER — IPRATROPIUM BROMIDE 0.06 % NA SOLN
2.0000 | Freq: Four times a day (QID) | NASAL | 0 refills | Status: DC
Start: 2022-05-10 — End: 2022-05-30

## 2022-05-10 MED ORDER — HYDROCOD POLI-CHLORPHE POLI ER 10-8 MG/5ML PO SUER
5.0000 mL | Freq: Two times a day (BID) | ORAL | 0 refills | Status: DC | PRN
Start: 2022-05-10 — End: 2022-05-30

## 2022-05-10 NOTE — Patient Instructions (Signed)
Thank you for coming to the office today.   1. You tested positive for Influenza A today (rapid flu swab test in office)  - Start Flu medicine today  Tamiflu (anti-flu medicine) take one capsule 75mg  twice a day for 5 days   - For symptom control (these are optional OTC medicines)      - Take Ibuprofen / Advil 400-600mg  every 6-8 hours as needed for fever / muscle aches, and may also take Tylenol 500-1000mg  per dose every 6-8 hours or 3 times a day, can alternate dosing     - May try OTC Mucinex up to 7-10 days then stop  If prescribed for you      - Start Tessalon perls one every 8 hours or 3 times a day as needed for cough      - Start Atrovent nasal spray decongestant 2 sprays in each nostril up to 4 times daily for 7 days  - Wash hands and cover cough very well to avoid spread of infection - Improve hydration with plenty of clear fluids    If significant worsening with poor fluid intake, worsening fever, difficulty breathing due to coughing, worsening body aches, weakness, or other more concerning symptoms difficulty breathing you can seek treatment at Emergency Department. Also if improved flu symptoms and then worsening days to week later with concerns for bronchitis, productive cough fever chills again we may need to check for possible pneumonia that can occur after the flu     Please schedule a Follow-up Appointment to: No follow-ups on file.  If you have any other questions or concerns, please feel free to call the office or send a message through [facility]. You may also schedule an earlier appointment if necessary.  Additionally, you may be receiving a survey about your experience at our office within a few days to 1 week by e-mail or mail. We value your feedback.  Nobie Putnam, DO [place]

## 2022-05-10 NOTE — Progress Notes (Signed)
Subjective:    Patient ID: Carl Thornton, male    DOB: Apr 14, 1970, 52 y.o.   MRN: [id number]  Carl Thornton is a 52 y.o. male presenting on 05/10/2022 for Influenza (Pt tested positive for Flu A yesterday (05/09/2022))  Patient presents for a same day appointment.  PCP Webb Silversmith, FNP   HPI  Influenza A Tested yesterday by family, positive Flu A Recent onset symptoms within past 2 days with headache cough tired sinus drainage Tried OTC Cold & Flu Recently viral URI sinusitis 03/2022 has resolved Denies any documented fever or chills, nausea vomiting, diarrhea      02/22/2021    9:59 AM 11/23/2020    9:46 AM 11/23/2019    9:06 AM  Depression screen PHQ 2/9  Decreased Interest 0 0 0  Down, Depressed, Hopeless 0 0 0  PHQ - 2 Score 0 0 0  Altered sleeping 0 1   Tired, decreased energy 0 1   Change in appetite 0 1   Feeling bad or failure about yourself  0 0   Trouble concentrating 1 1   Moving slowly or fidgety/restless 0 0   Suicidal thoughts 0 0   PHQ-9 Score 1 4   Difficult doing work/chores Not difficult at all Not difficult at all     Social History   Tobacco Use   Smoking status: Former    Types: Cigarettes   Smokeless tobacco: Never  Vaping Use   Vaping Use: Never used  Substance Use Topics   Alcohol use: Yes    Comment: 12+ beverages    Drug use: No    Review of Systems Per HPI unless specifically indicated above     Objective:    BP 126/68 (BP Location: Right Arm, Patient Position: Sitting, Cuff Size: Large)   Pulse 90   Temp (!) 96.4 F (35.8 C) (Temporal)   Wt 275 lb (124.7 kg)   SpO2 97%   BMI 44.39 kg/m   Wt Readings from Last 3 Encounters:  05/10/22 275 lb (124.7 kg)  03/08/22 278 lb (126.1 kg)  02/22/21 268 lb (121.6 kg)    Physical Exam Vitals and nursing note reviewed.  Constitutional:      General: He is not in acute distress.    Appearance: He is well-developed. He is not diaphoretic.     Comments:  Well-appearing, comfortable, cooperative  HENT:     Head: Normocephalic and atraumatic.  Eyes:     General:        Right eye: No discharge.        Left eye: No discharge.     Conjunctiva/sclera: Conjunctivae normal.  Neck:     Thyroid: No thyromegaly.  Cardiovascular:     Rate and Rhythm: Normal rate and regular rhythm.     Pulses: Normal pulses.     Heart sounds: Normal heart sounds. No murmur heard. Pulmonary:     Effort: Pulmonary effort is normal. No respiratory distress.     Breath sounds: Normal breath sounds. No wheezing or rales.     Comments: coughing Musculoskeletal:        General: Normal range of motion.     Cervical back: Normal range of motion and neck supple.  Lymphadenopathy:     Cervical: No cervical adenopathy.  Skin:    General: Skin is warm and dry.     Findings: No erythema or rash.  Neurological:     Mental Status: He is alert and oriented to person, place,  and time. Mental status is at baseline.  Psychiatric:        Behavior: Behavior normal.     Comments: Well groomed, good eye contact, normal speech and thoughts       Results for orders placed or performed in visit on 03/08/22  POC Influenza A&B (Binax test)  Result Value Ref Range   Influenza A, POC Negative Negative   Influenza B, POC Negative Negative  POC COVID-19  Result Value Ref Range   SARS Coronavirus 2 Ag Negative Negative      Assessment & Plan:   Problem List Items Addressed This Visit   None Visit Diagnoses     Influenza A    -  Primary   Relevant Medications   oseltamivir (TAMIFLU) 75 MG capsule   chlorpheniramine-HYDROcodone (TUSSIONEX) 10-8 MG/5ML   benzonatate (TESSALON) 100 MG capsule   ipratropium (ATROVENT) 0.06 % nasal spray       Clinically consistent with flu and confirmed Influenza A today based on home Flu Test done yesterday 05/09/22  - Duration x 2 days, without complication. Tolerating PO and well hydrated - No other focal findings of infection today -  Did not receive influenza vaccine this season  Plan: 1. Start Tamiflu 75mg  capsules BID x 5 days 2. Supportive care as advised with NSAID / Tylenol PRN fever/myalgias, improve hydration, may take OTC Cold/Flu meds 3. Start Tessalon Perls take 1 capsule up to 3 times a day as needed for cough 4. Codeine cough syrup 5. Start Atrovent nasal spray decongestant 2 sprays in each nostril up to 4 times daily for 7 days  Return criteria given if significant worsening, consider post-influenza complications, otherwise follow-up if needed  Work note given   Meds ordered this encounter  Medications   oseltamivir (TAMIFLU) 75 MG capsule    Sig: Take 1 capsule (75 mg total) by mouth 2 (two) times daily. For 5 days    Dispense:  10 capsule    Refill:  0   chlorpheniramine-HYDROcodone (TUSSIONEX) 10-8 MG/5ML    Sig: Take 5 mLs by mouth every 12 (twelve) hours as needed for cough.    Dispense:  115 mL    Refill:  0   benzonatate (TESSALON) 100 MG capsule    Sig: Take 1 capsule (100 mg total) by mouth 3 (three) times daily as needed for cough.    Dispense:  30 capsule    Refill:  0   ipratropium (ATROVENT) 0.06 % nasal spray    Sig: Place 2 sprays into both nostrils 4 (four) times daily. For up to 5-7 days then stop.    Dispense:  15 mL    Refill:  0      Follow up plan: Return if symptoms worsen or fail to improve.  Nobie Putnam, [facility] 05/10/2022, 8:25 AM

## 2022-05-30 ENCOUNTER — Ambulatory Visit (INDEPENDENT_AMBULATORY_CARE_PROVIDER_SITE_OTHER): Payer: BC Managed Care – PPO | Admitting: Internal Medicine

## 2022-05-30 ENCOUNTER — Encounter: Payer: Self-pay | Admitting: Internal Medicine

## 2022-05-30 VITALS — BP 126/84 | HR 73 | Temp 96.8°F | Ht 66.0 in | Wt 269.0 lb

## 2022-05-30 DIAGNOSIS — Z125 Encounter for screening for malignant neoplasm of prostate: Secondary | ICD-10-CM | POA: Diagnosis not present

## 2022-05-30 DIAGNOSIS — Z1211 Encounter for screening for malignant neoplasm of colon: Secondary | ICD-10-CM

## 2022-05-30 DIAGNOSIS — Z0001 Encounter for general adult medical examination with abnormal findings: Secondary | ICD-10-CM

## 2022-05-30 DIAGNOSIS — E1165 Type 2 diabetes mellitus with hyperglycemia: Secondary | ICD-10-CM

## 2022-05-30 DIAGNOSIS — Z6841 Body Mass Index (BMI) 40.0 and over, adult: Secondary | ICD-10-CM

## 2022-05-30 NOTE — Patient Instructions (Signed)
Health Maintenance, Male Adopting a healthy lifestyle and getting preventive care are important in promoting health and wellness. Ask your health care provider about: The right schedule for you to have regular tests and exams. Things you can do on your own to prevent diseases and keep yourself healthy. What should I know about diet, weight, and exercise? Eat a healthy diet  Eat a diet that includes plenty of vegetables, fruits, low-fat dairy products, and lean protein. Do not eat a lot of foods that are high in solid fats, added sugars, or sodium. Maintain a healthy weight Body mass index (BMI) is a measurement that can be used to identify possible weight problems. It estimates body fat based on height and weight. Your health care provider can help determine your BMI and help you achieve or maintain a healthy weight. Get regular exercise Get regular exercise. This is one of the most important things you can do for your health. Most adults should: Exercise for at least 150 minutes each week. The exercise should increase your heart rate and make you sweat (moderate-intensity exercise). Do strengthening exercises at least twice a week. This is in addition to the moderate-intensity exercise. Spend less time sitting. Even light physical activity can be beneficial. Watch cholesterol and blood lipids Have your blood tested for lipids and cholesterol at 52 years of age, then have this test every 5 years. You may need to have your cholesterol levels checked more often if: Your lipid or cholesterol levels are high. You are older than 52 years of age. You are at high risk for heart disease. What should I know about cancer screening? Many types of cancers can be detected early and may often be prevented. Depending on your health history and family history, you may need to have cancer screening at various ages. This may include screening for: Colorectal cancer. Prostate cancer. Skin cancer. Lung  cancer. What should I know about heart disease, diabetes, and high blood pressure? Blood pressure and heart disease High blood pressure causes heart disease and increases the risk of stroke. This is more likely to develop in people who have high blood pressure readings or are overweight. Talk with your health care provider about your target blood pressure readings. Have your blood pressure checked: Every 3-5 years if you are 18-39 years of age. Every year if you are 40 years old or older. If you are between the ages of 65 and 75 and are a current or former smoker, ask your health care provider if you should have a one-time screening for abdominal aortic aneurysm (AAA). Diabetes Have regular diabetes screenings. This checks your fasting blood sugar level. Have the screening done: Once every three years after age 45 if you are at a normal weight and have a low risk for diabetes. More often and at a younger age if you are overweight or have a high risk for diabetes. What should I know about preventing infection? Hepatitis B If you have a higher risk for hepatitis B, you should be screened for this virus. Talk with your health care provider to find out if you are at risk for hepatitis B infection. Hepatitis C Blood testing is recommended for: Everyone born from 1945 through 1965. Anyone with known risk factors for hepatitis C. Sexually transmitted infections (STIs) You should be screened each year for STIs, including gonorrhea and chlamydia, if: You are sexually active and are younger than 52 years of age. You are older than 52 years of age and your   health care provider tells you that you are at risk for this type of infection. Your sexual activity has changed since you were last screened, and you are at increased risk for chlamydia or gonorrhea. Ask your health care provider if you are at risk. Ask your health care provider about whether you are at high risk for HIV. Your health care provider  may recommend a prescription medicine to help prevent HIV infection. If you choose to take medicine to prevent HIV, you should first get tested for HIV. You should then be tested every 3 months for as long as you are taking the medicine. Follow these instructions at home: Alcohol use Do not drink alcohol if your health care provider tells you not to drink. If you drink alcohol: Limit how much you have to 0-2 drinks a day. Know how much alcohol is in your drink. In the [place] one drink equals one 12 oz bottle of beer (355 mL), one 5 oz glass of wine (148 mL), or one 1 oz glass of hard liquor (44 mL). Lifestyle Do not use any products that contain nicotine or tobacco. These products include cigarettes, chewing tobacco, and vaping devices, such as e-cigarettes. If you need help quitting, ask your health care provider. Do not use street drugs. Do not share needles. Ask your health care provider for help if you need support or information about quitting drugs. General instructions Schedule regular health, dental, and eye exams. Stay current with your vaccines. Tell your health care provider if: You often feel depressed. You have ever been abused or do not feel safe at home. Summary Adopting a healthy lifestyle and getting preventive care are important in promoting health and wellness. Follow your health care provider's instructions about healthy diet, exercising, and getting tested or screened for diseases. Follow your health care provider's instructions on monitoring your cholesterol and blood pressure. This information is not intended to replace advice given to you by your health care provider. Make sure you discuss any questions you have with your health care provider. Document Revised: 06/13/2020 Document Reviewed: 06/13/2020 Elsevier Patient Education  2023 Elsevier Inc.  

## 2022-05-30 NOTE — Progress Notes (Signed)
Subjective:    Patient ID: Carl Thornton, male    DOB: 13-Aug-1970, 52 y.o.   MRN: [id number]  HPI  Patient presents to clinic today for his annual exam.  Flu: 10/2020 Tetanus: 05/2019 COVID: Pfizer x 2 Pneumovax: 11/2020 Shingrix: Never PSA screening: 11/2019 Colon screening: Never Vision screening: annually Dentist: biannually  Diet: He does eat meat. He consumes fruits and veggies. He tries to avoid fried foods. He drinks mostly water and soda Exercise: None  Review of Systems     Past Medical History:  Diagnosis Date   History of prediabetes    Hypertension    not on medication    Current Outpatient Medications  Medication Sig Dispense Refill   albuterol (VENTOLIN HFA) 108 (90 Base) MCG/ACT inhaler Inhale 2 puffs into the lungs every 6 (six) hours as needed.     benzonatate (TESSALON) 100 MG capsule Take 1 capsule (100 mg total) by mouth 3 (three) times daily as needed for cough. 30 capsule 0   blood glucose meter kit and supplies Dispense based on patient and insurance preference. Use up to four times daily as directed. (FOR ICD-10 E10.9, E11.9). 1 each 0   chlorpheniramine-HYDROcodone (TUSSIONEX) 10-8 MG/5ML Take 5 mLs by mouth every 12 (twelve) hours as needed for cough. 115 mL 0   fluticasone (FLONASE) 50 MCG/ACT nasal spray Place 2 sprays into both nostrils daily. Use for 4-6 weeks then stop and use seasonally or as needed. 16 g 0   ipratropium (ATROVENT) 0.06 % nasal spray Place 2 sprays into both nostrils 4 (four) times daily. For up to 5-7 days then stop. 15 mL 0   oseltamivir (TAMIFLU) 75 MG capsule Take 1 capsule (75 mg total) by mouth 2 (two) times daily. For 5 days 10 capsule 0   simvastatin (ZOCOR) 40 MG tablet Take 1 tablet (40 mg total) by mouth at bedtime. 90 tablet 3   tadalafil (CIALIS) 20 MG tablet Take 1 tablet (20 mg total) by mouth daily as needed for erectile dysfunction. 30 tablet 6   No current facility-administered medications for  this visit.    No Known Allergies  Family History  Problem Relation Age of Onset   Diabetes Mother    Asthma Father    Diabetes Sister    Heart attack Neg Hx    Stroke Neg Hx    Cancer Neg Hx     Social History   Socioeconomic History   Marital status: Married    Spouse name: Not on file   Number of children: Not on file   Years of education: Not on file   Highest education level: Not on file  Occupational History   Not on file  Tobacco Use   Smoking status: Former    Types: Cigarettes   Smokeless tobacco: Never  Vaping Use   Vaping Use: Never used  Substance and Sexual Activity   Alcohol use: Yes    Comment: 12+ beverages    Drug use: No   Sexual activity: Yes  Other Topics Concern   Not on file  Social History Narrative   Not on file   Social Determinants of Health   Financial Resource Strain: Not on file  Food Insecurity: Not on file  Transportation Needs: Not on file  Physical Activity: Not on file  Stress: Not on file  Social Connections: Not on file  Intimate Partner Violence: Not on file     Constitutional: Denies fever, malaise, fatigue, headache or abrupt weight  changes.  HEENT: Denies eye pain, eye redness, ear pain, ringing in the ears, wax buildup, runny nose, nasal congestion, bloody nose, or sore throat. Respiratory: Denies difficulty breathing, shortness of breath, cough or sputum production.   Cardiovascular: Denies chest pain, chest tightness, palpitations or swelling in the hands or feet.  Gastrointestinal: Pt reports intermittent reflux. Denies abdominal pain, bloating, constipation, diarrhea or blood in the stool.  GU: Denies urgency, frequency, pain with urination, burning sensation, blood in urine, odor or discharge. Musculoskeletal: Patient reports intermittent thigh/knee pain.  Denies decrease in range of motion, difficulty with gait, or joint pain and swelling.  Skin: Denies redness, rashes, lesions or ulcercations.  Neurological:  Denies dizziness, difficulty with memory, difficulty with speech or problems with balance and coordination.  Psych: Denies anxiety, depression, SI/HI.  No other specific complaints in a complete review of systems (except as listed in HPI above).  Objective:   Physical Exam  BP 126/84 (BP Location: Left Arm, Patient Position: Sitting, Cuff Size: Large)   Pulse 73   Temp (!) 96.8 F (36 C) (Temporal)   Ht 5\' 6"  (1.676 m)   Wt 269 lb (122 kg)   SpO2 98%   BMI 43.42 kg/m   Wt Readings from Last 3 Encounters:  05/10/22 275 lb (124.7 kg)  03/08/22 278 lb (126.1 kg)  02/22/21 268 lb (121.6 kg)    General: Appears his stated age, obese, in NAD. Skin: Warm, dry and intact.  No ulcerations noted. HEENT: Head: normal shape and size; Eyes: sclera white, no icterus, conjunctiva pink, PERRLA and EOMs intact;  Neck:  Neck supple, trachea midline. No masses, lumps or thyromegaly present.  Cardiovascular: Normal rate and rhythm. S1,S2 noted.  No murmur, rubs or gallops noted. No JVD or BLE edema. No carotid bruits noted. Pulmonary/Chest: Normal effort and positive vesicular breath sounds. No respiratory distress. No wheezes, rales or ronchi noted.  Abdomen: Normal bowel sounds.  Musculoskeletal: Strength 5/5 BUE/BLE. No difficulty with gait.  Neurological: Alert and oriented. Cranial nerves II-XII grossly intact. Coordination normal.  Psychiatric: Mood and affect normal. Behavior is normal. Judgment and thought content normal.     BMET    Component Value Date/Time   NA 137 11/23/2020 0955   NA 140 07/25/2011 0817   K 4.1 11/23/2020 0955   K 4.0 07/25/2011 0817   CL 106 11/23/2020 0955   CL 107 07/25/2011 0817   CO2 24 11/23/2020 0955   CO2 26 07/25/2011 0817   GLUCOSE 100 11/23/2020 0955   GLUCOSE 103 (H) 07/25/2011 0817   BUN 17 11/23/2020 0955   BUN 11 07/25/2011 0817   CREATININE 0.59 (L) 11/23/2020 0955   CALCIUM 9.0 11/23/2020 0955   CALCIUM 8.4 (L) 07/25/2011 0817    GFRNONAA 114 11/23/2019 0940   GFRAA 132 11/23/2019 0940    Lipid Panel     Component Value Date/Time   CHOL 201 (H) 02/22/2021 1005   TRIG 342 (H) 02/22/2021 1005   HDL 33 (L) 02/22/2021 1005   CHOLHDL 6.1 (H) 02/22/2021 1005   VLDL 33 (H) 10/03/2016 0921   LDLCALC 119 (H) 02/22/2021 1005    CBC    Component Value Date/Time   WBC 5.2 11/23/2020 0955   RBC 5.09 11/23/2020 0955   HGB 15.3 11/23/2020 0955   HCT 46.2 11/23/2020 0955   PLT 282 11/23/2020 0955   MCV 90.8 11/23/2020 0955   MCH 30.1 11/23/2020 0955   MCHC 33.1 11/23/2020 0955   RDW 13.5 11/23/2020  0955   LYMPHSABS 2,083 11/23/2019 0940   MONOABS 230 10/03/2016 0921   EOSABS 151 11/23/2019 0940   BASOSABS 62 11/23/2019 0940    Hgb A1C Lab Results  Component Value Date   HGBA1C 6.7 (H) 02/22/2021           Assessment & Plan:   Preventative Health Maintenance:  Encouraged him to get a flu shot in the fall Tetanus UTD Encouraged him to get his COVID booster Pneumovax UTD Discussed Shingrix vaccine, he will check coverage with his insurance company schedule visit if he would like to have this done Referral to GI for screening colonoscopy Encouraged him to consume a balanced diet and exercise regimen Advised him to see an eye doctor and dentist annually We will check CBC, c-Met, lipid, A1c, urine microalbumin and PSA today  RTC in 3 months, follow-up chronic conditions Nicki Reaper, NP

## 2022-05-30 NOTE — Assessment & Plan Note (Signed)
Encourage diet and exercise for weight loss 

## 2022-05-31 LAB — CBC
HCT: 47 % (ref 38.5–50.0)
Hemoglobin: 15.4 g/dL (ref 13.2–17.1)
MCH: 29.3 pg (ref 27.0–33.0)
MCHC: 32.8 g/dL (ref 32.0–36.0)
MCV: 89.4 fL (ref 80.0–100.0)
MPV: 9.8 fL (ref 7.5–12.5)
Platelets: 346 10*3/uL (ref 140–400)
RBC: 5.26 10*6/uL (ref 4.20–5.80)
RDW: 13.4 % (ref 11.0–15.0)
WBC: 4.6 10*3/uL (ref 3.8–10.8)

## 2022-05-31 LAB — COMPLETE METABOLIC PANEL WITH GFR
AG Ratio: 1.4 (calc) (ref 1.0–2.5)
ALT: 23 U/L (ref 9–46)
AST: 21 U/L (ref 10–35)
Albumin: 4.2 g/dL (ref 3.6–5.1)
Alkaline phosphatase (APISO): 94 U/L (ref 35–144)
BUN/Creatinine Ratio: 21 (calc) (ref 6–22)
BUN: 13 mg/dL (ref 7–25)
CO2: 25 mmol/L (ref 20–32)
Calcium: 9.1 mg/dL (ref 8.6–10.3)
Chloride: 106 mmol/L (ref 98–110)
Creat: 0.63 mg/dL — ABNORMAL LOW (ref 0.70–1.30)
Globulin: 2.9 g/dL (calc) (ref 1.9–3.7)
Glucose, Bld: 126 mg/dL — ABNORMAL HIGH (ref 65–99)
Potassium: 4.3 mmol/L (ref 3.5–5.3)
Sodium: 140 mmol/L (ref 135–146)
Total Bilirubin: 0.8 mg/dL (ref 0.2–1.2)
Total Protein: 7.1 g/dL (ref 6.1–8.1)
eGFR: 115 mL/min/{1.73_m2} (ref >=60)

## 2022-05-31 LAB — LIPID PANEL
Cholesterol: 213 mg/dL — ABNORMAL HIGH (ref <200)
HDL: 35 mg/dL — ABNORMAL LOW (ref >=40)
LDL Cholesterol (Calc): 143 mg/dL (calc) — ABNORMAL HIGH
Non-HDL Cholesterol (Calc): 178 mg/dL (calc) — ABNORMAL HIGH (ref <130)
Total CHOL/HDL Ratio: 6.1 (calc) — ABNORMAL HIGH (ref <5.0)
Triglycerides: 212 mg/dL — ABNORMAL HIGH (ref <150)

## 2022-05-31 LAB — HEMOGLOBIN A1C
Hgb A1c MFr Bld: 7.3 % of total Hgb — ABNORMAL HIGH (ref <5.7)
Mean Plasma Glucose: 163 mg/dL
eAG (mmol/L): 9 mmol/L

## 2022-05-31 LAB — PSA: PSA: 0.53 ng/mL (ref <=4.00)

## 2022-05-31 LAB — MICROALBUMIN / CREATININE URINE RATIO
Creatinine, Urine: 151 mg/dL (ref 20–320)
Microalb Creat Ratio: 6 mg/g creat (ref <30)
Microalb, Ur: 0.9 mg/dL

## 2022-06-05 ENCOUNTER — Ambulatory Visit: Payer: Self-pay | Admitting: *Deleted

## 2022-06-05 MED ORDER — ATORVASTATIN CALCIUM 10 MG PO TABS: 10.0000 mg | ORAL_TABLET | Freq: Every day | ORAL | 0 refills | Status: DC

## 2022-06-05 MED ORDER — METFORMIN HCL 500 MG PO TABS: 500.0000 mg | ORAL_TABLET | Freq: Two times a day (BID) | ORAL | 0 refills | Status: DC

## 2022-06-05 NOTE — Addendum Note (Signed)
Addended by: Lorre Munroe on: 06/05/2022 08:28 AM   Modules accepted: Orders

## 2022-06-05 NOTE — Telephone Encounter (Signed)
Late entry. Patient called in yesterday for lab results and reviewed by NT see previous note from 06/04/22. Patient requesting if PCP can send copy of labs to home to review. Address in chart verified. Recommended patient to try to use My Chart to view all results as well.

## 2022-06-11 ENCOUNTER — Other Ambulatory Visit: Payer: Self-pay | Admitting: Urology

## 2022-06-12 ENCOUNTER — Encounter: Payer: Self-pay | Admitting: *Deleted

## 2022-07-03 NOTE — Progress Notes (Signed)
07/04/2022 8:38 AM   Carl Thornton 1970-02-16 [id number]  Referring provider: Lorre Munroe, NP [address] [place],  [place] [address]  Urological history: 1. Erectile dysfunction -Contributing factors age, history of smoking, hypercholesterolemia, diabetes, sleep apnea and obesity -Tadalafil 20 mg, on-demand dosing  2. Prostate cancer screening -PSA (05/2022) 0.53   No chief complaint on file.   HPI: Carl Thornton is a 52 y.o. male who presents today for one year follow up.   Previous records reviewed.   SHIM ***    Score: 1-7 Severe ED 8-11 Moderate ED 12-16 Mild-Moderate ED 17-21 Mild ED 22-25 No ED   PMH: Past Medical History:  Diagnosis Date   History of prediabetes    Hypertension    not on medication    Surgical History: Past Surgical History:  Procedure Laterality Date   ANKLE SURGERY     HERNIA REPAIR     umbilical    Home Medications:  Allergies as of 07/04/2022   No Known Allergies      Medication List        Accurate as of Jul 03, 2022  8:38 AM. If you have any questions, ask your nurse or doctor.          albuterol 108 (90 Base) MCG/ACT inhaler Commonly known as: VENTOLIN HFA Inhale 2 puffs into the lungs every 6 (six) hours as needed.   atorvastatin 10 MG tablet Commonly known as: LIPITOR Take 1 tablet (10 mg total) by mouth daily.   blood glucose meter kit and supplies Dispense based on patient and insurance preference. Use up to four times daily as directed. (FOR ICD-10 E10.9, E11.9).   fluticasone 50 MCG/ACT nasal spray Commonly known as: FLONASE Place 2 sprays into both nostrils daily. Use for 4-6 weeks then stop and use seasonally or as needed.   metFORMIN 500 MG tablet Commonly known as: GLUCOPHAGE Take 1 tablet (500 mg total) by mouth 2 (two) times daily with a meal.        Allergies: No Known Allergies  Family History: Family History  Problem Relation Age of Onset    Diabetes Mother    Asthma Father    Diabetes Sister    Healthy Sister    Healthy Sister    Healthy Sister    Healthy Brother    Healthy Brother    Healthy Brother    Heart attack Neg Hx    Stroke Neg Hx    Cancer Neg Hx     Social History:  reports that he has been smoking cigarettes. He has never used smokeless tobacco. He reports current alcohol use. He reports that he does not use drugs.  ROS: Pertinent ROS in HPI  Physical Exam: There were no vitals taken for this visit.  Constitutional:  Well nourished. Alert and oriented, No acute distress. HEENT: [place] AT, moist mucus membranes.  Trachea midline, no masses. Cardiovascular: No clubbing, cyanosis, or edema. Respiratory: Normal respiratory effort, no increased work of breathing. GI: Abdomen is soft, non tender, non distended, no abdominal masses. Liver and spleen not palpable.  No hernias appreciated.  Stool sample for occult testing is not indicated.   GU: No CVA tenderness.  No bladder fullness or masses.  Patient with circumcised/uncircumcised phallus. ***Foreskin easily retracted***  Urethral meatus is patent.  No penile discharge. No penile lesions or rashes. Scrotum without lesions, cysts, rashes and/or edema.  Testicles are located scrotally bilaterally. No masses are appreciated in the testicles.  Left and right epididymis are normal. Rectal: Patient with  normal sphincter tone. Anus and perineum without scarring or rashes. No rectal masses are appreciated. Prostate is approximately *** grams, *** nodules are appreciated. Seminal vesicles are normal. Skin: No rashes, bruises or suspicious lesions. Lymph: No cervical or inguinal adenopathy. Neurologic: Grossly intact, no focal deficits, moving all 4 extremities. Psychiatric: Normal mood and affect.  Laboratory Data: Lab Results  Component Value Date   WBC 4.6 05/30/2022   HGB 15.4 05/30/2022   HCT 47.0 05/30/2022   MCV 89.4 05/30/2022   PLT 346 05/30/2022    Lab  Results  Component Value Date   CREATININE 0.63 (L) 05/30/2022    Lab Results  Component Value Date   PSA 0.53 05/30/2022   PSA 0.21 11/23/2019    Lab Results  Component Value Date   HGBA1C 7.3 (H) 05/30/2022       Component Value Date/Time   CHOL 213 (H) 05/30/2022 0847   HDL 35 (L) 05/30/2022 0847   CHOLHDL 6.1 (H) 05/30/2022 0847   VLDL 33 (H) 10/03/2016 0921   LDLCALC 143 (H) 05/30/2022 0847    Lab Results  Component Value Date   AST 21 05/30/2022   Lab Results  Component Value Date   ALT 23 05/30/2022  I have reviewed the labs.   Pertinent Imaging: N/A  Assessment & Plan:  ***  1. Erectile dysfunction - I explained to the patient that in order to achieve an erection it takes good functioning of the nervous system (parasympathetic and rs, sympathetic, sensory and motor), good blood flow into the erectile tissue of the penis and a desire to have sex - I explained that conditions like diabetes, hypertension, coronary artery disease, peripheral vascular disease, smoking, alcohol consumption, age, sleep apnea and BPH can diminish the ability to have an erection - I explained the ED may be a risk marker for underlying CVD and he should follow up with PCP for further studies *** - we will obtain a serum testosterone level at this time; if it is abnormal we will need to repeat the study for confirmation *** - A recent study published in Sex Med 2018 Apr 13 revealed moderate to vigorous aerobic exercise for 40 minutes 4 times per week can decrease erectile problems caused by physical inactivity, obesity, hypertension, metabolic syndrome and/or cardiovascular diseases *** - We discussed trying a *** different PDE5 inhibitor, intra-urethral suppositories, intracavernous vasoactive drug injection therapy, vacuum erection devices, LI-ESWT and penile prosthesis implantation   No follow-ups on file.  These notes generated with voice recognition software. I apologize for  typographical errors.  Cloretta Ned  [facility] [address]  [address] [place], [place] [address] [phone]

## 2022-07-04 ENCOUNTER — Ambulatory Visit (INDEPENDENT_AMBULATORY_CARE_PROVIDER_SITE_OTHER): Payer: BC Managed Care – PPO | Admitting: Urology

## 2022-07-04 ENCOUNTER — Encounter: Payer: Self-pay | Admitting: Urology

## 2022-07-04 VITALS — BP 138/64 | HR 81 | Ht 66.0 in | Wt 274.0 lb

## 2022-07-04 DIAGNOSIS — N5201 Erectile dysfunction due to arterial insufficiency: Secondary | ICD-10-CM | POA: Diagnosis not present

## 2022-07-04 MED ORDER — TADALAFIL 20 MG PO TABS: 20.0000 mg | ORAL_TABLET | Freq: Every day | ORAL | 3 refills | Status: DC | PRN

## 2022-08-26 ENCOUNTER — Other Ambulatory Visit: Payer: Self-pay | Admitting: Internal Medicine

## 2022-08-28 NOTE — Telephone Encounter (Signed)
Requested Prescriptions  Pending Prescriptions Disp Refills   metFORMIN (GLUCOPHAGE) 500 MG tablet [Pharmacy Med Name: METFORMIN HCL 500 MG TABLET] 180 tablet 0    Sig: TAKE 1 TABLET BY MOUTH 2 TIMES DAILY WITH A MEAL.     Endocrinology:  Diabetes - Biguanides Failed - 08/26/2022  8:39 AM      Failed - Cr in normal range and within 360 days    Creat  Date Value Ref Range Status  05/30/2022 0.63 (L) 0.70 - 1.30 mg/dL Final   Creatinine, Urine  Date Value Ref Range Status  05/30/2022 151 20 - 320 mg/dL Final         Failed - B12 Level in normal range and within 720 days    No results found for: "VITAMINB12"       Failed - CBC within normal limits and completed in the last 12 months    WBC  Date Value Ref Range Status  05/30/2022 4.6 3.8 - 10.8 Thousand/uL Final   RBC  Date Value Ref Range Status  05/30/2022 5.26 4.20 - 5.80 Million/uL Final   Hemoglobin  Date Value Ref Range Status  05/30/2022 15.4 13.2 - 17.1 g/dL Final   HCT  Date Value Ref Range Status  05/30/2022 47.0 38.5 - 50.0 % Final   MCHC  Date Value Ref Range Status  05/30/2022 32.8 32.0 - 36.0 g/dL Final   MCH  Date Value Ref Range Status  05/30/2022 29.3 27.0 - 33.0 pg Final   MCV  Date Value Ref Range Status  05/30/2022 89.4 80.0 - 100.0 fL Final   No results found for: "PLTCOUNTKUC", "LABPLAT", "POCPLA" RDW  Date Value Ref Range Status  05/30/2022 13.4 11.0 - 15.0 % Final         Passed - HBA1C is between 0 and 7.9 and within 180 days    Hgb A1c MFr Bld  Date Value Ref Range Status  05/30/2022 7.3 (H) <5.7 % of total Hgb Final    Comment:    For someone without known diabetes, a hemoglobin A1c value of 6.5% or greater indicates that they may have  diabetes and this should be confirmed with a follow-up  test. . For someone with known diabetes, a value <7% indicates  that their diabetes is well controlled and a value  greater than or equal to 7% indicates suboptimal  control. A1c targets  should be individualized based on  duration of diabetes, age, comorbid conditions, and  other considerations. . Currently, no consensus exists regarding use of hemoglobin A1c for diagnosis of diabetes for children. .          Passed - eGFR in normal range and within 360 days    GFR, Est African American  Date Value Ref Range Status  11/23/2019 132 > OR = 60 mL/min/1.60m2 Final   GFR, Est Non African American  Date Value Ref Range Status  11/23/2019 114 > OR = 60 mL/min/1.11m2 Final   eGFR  Date Value Ref Range Status  05/30/2022 115 > OR = 60 mL/min/1.72m2 Final         Passed - Valid encounter within last 6 months    Recent Outpatient Visits           3 months ago Encounter for general adult medical examination with abnormal findings   [place] [facility] [place], Salvadore Oxford, NP   3 months ago Influenza A    [facility] [place], Netta Neat, [place]  5 months ago Viral URI with cough   [place] [facility] Smitty Cords, DO   1 year ago Mixed hyperlipidemia   [place] [facility] [place], Salvadore Oxford, NP   1 year ago Encounter for general adult medical examination with abnormal findings   [place] [facility] [place], Salvadore Oxford, NP       Future Appointments             In 4 weeks Sampson Si, Salvadore Oxford, NP [place] [facility]   In 10 months [place], Elana Al[facility] [place]             atorvastatin (LIPITOR) 10 MG tablet [Pharmacy Med Name: ATORVASTATIN 10 MG TABLET] 90 tablet 0    Sig: TAKE 1 TABLET BY MOUTH EVERY DAY     Cardiovascular:  Antilipid - Statins Failed - 08/26/2022  8:39 AM      Failed - Lipid Panel in normal range within the last 12 months    Cholesterol  Date Value Ref Range Status  05/30/2022 213 (H) <200 mg/dL Final   LDL Cholesterol (Calc)  Date Value Ref Range Status  05/30/2022 143 (H) mg/dL  (calc) Final    Comment:    Reference range: <100 . Desirable range <100 mg/dL for primary prevention;   <70 mg/dL for patients with CHD or diabetic patients  with > or = 2 CHD risk factors. Marland Kitchen LDL-C is now calculated using the Martin-Hopkins  calculation, which is a validated novel method providing  better accuracy than the Friedewald equation in the  estimation of LDL-C.  Horald Pollen et al. Lenox Ahr. 4098;119(14): 2061-2068  (http://education.QuestDiagnostics.com/faq/FAQ164)    HDL  Date Value Ref Range Status  05/30/2022 35 (L) > OR = 40 mg/dL Final   Triglycerides  Date Value Ref Range Status  05/30/2022 212 (H) <150 mg/dL Final    Comment:    . If a non-fasting specimen was collected, consider repeat triglyceride testing on a fasting specimen if clinically indicated.  Perry Mount et al. J. of Clin. Lipidol. 2015;9:129-169. Marland Kitchen          Passed - Patient is not pregnant      Passed - Valid encounter within last 12 months    Recent Outpatient Visits           3 months ago Encounter for general adult medical examination with abnormal findings   [facility] [place], Salvadore Oxford, NP   3 months ago Influenza A   [place] [facility] Smitty Cords, DO   5 months ago Viral URI with cough   [place] [facility] Smitty Cords, DO   1 year ago Mixed hyperlipidemia   [place] [facility] [place], Salvadore Oxford, NP   1 year ago Encounter for general adult medical examination with abnormal findings   [place] [facility] [place], Salvadore Oxford, NP       Future Appointments             In 4 weeks Sampson Si, Salvadore Oxford, NP  [facility]   In 10 months [place], Elana Alm [facility]

## 2022-08-29 ENCOUNTER — Ambulatory Visit: Payer: BC Managed Care – PPO | Admitting: Internal Medicine

## 2022-09-18 ENCOUNTER — Emergency Department
Admission: EM | Admit: 2022-09-18 | Discharge: 2022-09-18 | Disposition: A | Discharge status: Home / Self Care | Payer: BC Managed Care – PPO | Attending: Emergency Medicine | Admitting: Emergency Medicine

## 2022-09-18 ENCOUNTER — Other Ambulatory Visit: Payer: Self-pay

## 2022-09-18 DIAGNOSIS — U071 COVID-19: Secondary | ICD-10-CM | POA: Diagnosis not present

## 2022-09-18 DIAGNOSIS — J40 Bronchitis, not specified as acute or chronic: Secondary | ICD-10-CM | POA: Diagnosis not present

## 2022-09-18 DIAGNOSIS — R1084 Generalized abdominal pain: Secondary | ICD-10-CM | POA: Insufficient documentation

## 2022-09-18 DIAGNOSIS — E119 Type 2 diabetes mellitus without complications: Secondary | ICD-10-CM | POA: Diagnosis not present

## 2022-09-18 DIAGNOSIS — R059 Cough, unspecified: Secondary | ICD-10-CM | POA: Diagnosis not present

## 2022-09-18 DIAGNOSIS — J441 Chronic obstructive pulmonary disease with (acute) exacerbation: Secondary | ICD-10-CM | POA: Diagnosis not present

## 2022-09-18 DIAGNOSIS — J45909 Unspecified asthma, uncomplicated: Secondary | ICD-10-CM | POA: Diagnosis not present

## 2022-09-18 LAB — RESP PANEL BY RT-PCR (RSV, FLU A&B, COVID)  RVPGX2
Influenza A by PCR: NEGATIVE
Influenza B by PCR: NEGATIVE
Resp Syncytial Virus by PCR: NEGATIVE
SARS Coronavirus 2 by RT PCR: POSITIVE — AB

## 2022-09-18 MED ORDER — AEROCHAMBER Z-STAT PLUS/MEDIUM MISC
1.0000 | Freq: Once | Status: AC
  Administered 2022-09-18: 1
  Filled 2022-09-18: qty 1

## 2022-09-18 MED ORDER — IBUPROFEN 600 MG PO TABS
600.0000 mg | ORAL_TABLET | Freq: Once | ORAL | Status: AC
  Administered 2022-09-18: 600 mg via ORAL
  Filled 2022-09-18: qty 1

## 2022-09-18 MED ORDER — IBUPROFEN 600 MG PO TABS: 600.0000 mg | ORAL_TABLET | Freq: Once | ORAL | Status: DC

## 2022-09-18 MED ORDER — GUAIFENESIN-DM 100-10 MG/5ML PO SYRP
5.0000 mL | ORAL_SOLUTION | Freq: Once | ORAL | Status: AC
  Administered 2022-09-18: 5 mL via ORAL
  Filled 2022-09-18: qty 10

## 2022-09-18 MED ORDER — BENZONATATE 100 MG PO CAPS: 100.0000 mg | ORAL_CAPSULE | Freq: Three times a day (TID) | ORAL | 0 refills | Status: DC | PRN

## 2022-09-18 MED ORDER — ALBUTEROL SULFATE HFA 108 (90 BASE) MCG/ACT IN AERS
1.0000 | INHALATION_SPRAY | Freq: Once | RESPIRATORY_TRACT | Status: AC
  Administered 2022-09-18: 1 via RESPIRATORY_TRACT
  Filled 2022-09-18: qty 6.7

## 2022-09-18 MED ORDER — ACETAMINOPHEN 325 MG PO TABS
650.0000 mg | ORAL_TABLET | Freq: Once | ORAL | Status: AC | PRN
  Administered 2022-09-18: 650 mg via ORAL
  Filled 2022-09-18: qty 2

## 2022-09-18 NOTE — ED Provider Notes (Signed)
[facility] Provider Note   Event Date/Time   First MD Initiated Contact with Patient 09/18/22 2217     (approximate) History  Cough  HPI Carl Thornton is a 52 y.o. male with a past medical history of diabetes who presents complaining of cough, fever, generalized abdominal pain, lightheadedness, and chills.  Patient denies any recent travel or sick contacts.  Patient also endorses eye pain. ROS: Patient currently denies any vision changes, tinnitus, difficulty speaking, facial droop, sore throat, chest pain, shortness of breath, abdominal pain, nausea/vomiting/diarrhea, dysuria, or weakness/numbness/paresthesias in any extremity   Physical Exam  Triage Vital Signs: ED Triage Vitals  Encounter Vitals Group     BP 09/18/22 2152 103/86     Systolic BP Percentile --      Diastolic BP Percentile --      Pulse Rate 09/18/22 2152 (!) 115     Resp 09/18/22 2152 20     Temp 09/18/22 2152 (!) 102.7 F (39.3 C)     Temp Source 09/18/22 2152 Oral     SpO2 09/18/22 2152 95 %     Weight --      Height --      Head Circumference --      Peak Flow --      Pain Score 09/18/22 2151 0     Pain Loc --      Pain Education --      Exclude from Growth Chart --    Most recent vital signs: Vitals:   09/18/22 2152 09/18/22 2248  BP: 103/86   Pulse: (!) 115   Resp: 20   Temp: (!) 102.7 F (39.3 C) (!) 102.9 F (39.4 C)  SpO2: 95%    General: Awake, oriented x4. CV:  Good peripheral perfusion.  Resp:  Normal effort.  Mild end expiratory wheezes bilaterally Abd:  No distention.  Other:  Obese middle-aged Hispanic male resting comfortably in no acute distress ED Results / Procedures / Treatments  Labs (all labs ordered are listed, but only abnormal results are displayed) Labs Reviewed  RESP PANEL BY RT-PCR (RSV, FLU A&B, COVID)  [id number] - Abnormal; Notable for the following components:      Result Value   SARS Coronavirus 2 by RT PCR POSITIVE (*)     All other components within normal limits  PROCEDURES: Critical Care performed: No Procedures MEDICATIONS ORDERED IN ED: Medications  aerochamber Z-Stat Plus/medium 1 each (has no administration in time range)  ibuprofen (ADVIL) tablet 600 mg (has no administration in time range)  acetaminophen (TYLENOL) tablet 650 mg (650 mg Oral Given 09/18/22 2155)  albuterol (VENTOLIN HFA) 108 (90 Base) MCG/ACT inhaler 1 puff (1 puff Inhalation Given 09/18/22 2242)  guaiFENesin-dextromethorphan (ROBITUSSIN DM) 100-10 MG/5ML syrup 5 mL (5 mLs Oral Given 09/18/22 2236)   IMPRESSION / MDM / ASSESSMENT AND PLAN / ED COURSE  I reviewed the triage vital signs and the nursing notes.                              Patient's presentation is most consistent with acute presentation with potential threat to life or bodily function. Presentation most consistent with Viral Syndrome.  Patient has tested positive for COVID-19. Based on vitals and exam they are nontoxic and stable for discharge.  Given History and Exam I have a lower suspicion for: Emergent CardioPulmonary causes [such as Acute Asthma or COPD Exacerbation, acute Heart Failure or  exacerbation, PE, PTX, atypical ACS, PNA]. Emergent Otolaryngeal causes [such as PTA, RPA, Ludwigs, Epiglottitis, EBV].  Regarding Emergent Travel or Immunosuppressive related infectious: I have a low suspicion for acute HIV.  Will provide strict return precautions and instructions on self-isolation/quarantine and anticipatory guidance.   FINAL CLINICAL IMPRESSION(S) / ED DIAGNOSES   Final diagnoses:  Bronchitis  COVID-19 virus infection   Rx / DC Orders   ED Discharge Orders          Ordered    benzonatate (TESSALON PERLES) 100 MG capsule  3 times daily PRN        09/18/22 2224           Note:  This document was prepared using Dragon voice recognition software and may include unintentional dictation errors.   Merwyn Katos, MD 09/18/22 [id number]

## 2022-09-18 NOTE — Discharge Instructions (Addendum)
Please use ibuprofen (Motrin) up to 800 mg every 8 hours, naproxen (Naprosyn) up to 500 mg every 12 hours, and/or acetaminophen (Tylenol) up to 4 g/day for any continued fevers, soreness, and/or pain.  Please do not use this medication regimen for longer than 7 days

## 2022-09-18 NOTE — ED Triage Notes (Signed)
Pt to ED via POV c/o cough, runny nose, congestion since last night. No known sick contacts. Denies CP, SOB. Has been taking tessalon pearls for cough

## 2022-09-24 ENCOUNTER — Ambulatory Visit: Payer: Self-pay | Admitting: *Deleted

## 2022-09-24 ENCOUNTER — Ambulatory Visit (INDEPENDENT_AMBULATORY_CARE_PROVIDER_SITE_OTHER): Payer: BC Managed Care – PPO | Admitting: Internal Medicine

## 2022-09-24 ENCOUNTER — Encounter: Payer: Self-pay | Admitting: Internal Medicine

## 2022-09-24 VITALS — BP 116/68 | HR 80 | Temp 96.3°F | Wt 265.0 lb

## 2022-09-24 DIAGNOSIS — J208 Acute bronchitis due to other specified organisms: Secondary | ICD-10-CM

## 2022-09-24 DIAGNOSIS — U071 COVID-19: Secondary | ICD-10-CM

## 2022-09-24 MED ORDER — PREDNISONE 10 MG PO TABS: ORAL_TABLET | ORAL | 0 refills | Status: DC

## 2022-09-24 NOTE — Patient Instructions (Signed)
COVID-19 COVID-19 El COVID-19 es una infeccin causada por un virus que se denomina SARS-CoV-2. Este tipo de virus se llama coronavirus. Las personas con COVID-19 pueden: Tener pocos sntomas o ninguno. Tener sntomas leves o moderados que afectan los pulmones y la respiracin. Enfermarse mucho. Cules son las causas? El COVID-19 es causado por un virus. Este virus puede encontrarse en el aire en forma de gotitas o en superficies. Puede transmitirse de [place] persona infectada al toser, estornudar, hablar, cantar o respirar. Una persona puede infectarse si: Inhala las gotitas infectadas que estn en el aire. Toca un objeto que tiene el virus. Qu incrementa el riesgo? Tiene riesgo de contraer COVID-19 si ha estado cerca de alguien con la infeccin. Es ms probable que se enferme gravemente si: Tiene 65 aos o ms. Tiene ciertas enfermedades crnicas, como: Enfermedades cardacas. Diabetes. Enfermedad respiratoria crnica. Cncer. Embarazo. Est inmunodeprimido. Esto significa que el cuerpo no puede combatir las infecciones fcilmente. Tiene una discapacidad o dificultad para moverse, lo que significa que est inmvil. Cules son los signos o sntomas? Los sntomas del COVID-19 pueden ser diferentes en cada persona. Los sntomas tambin pueden ser de leves a graves. Generalmente aparecen entre 5 y 6 das despus de la infeccin. Pero pueden tardar [employer] [address] en aparecer. Los sntomas ms frecuentes son: Leonette Most. Cansancio. Nueva prdida del sentido del gusto o del olfato. Grant Ruts. Los sntomas menos frecuentes son: Dolor de Advertising copywriter. Dolor de [place]. Dolores musculares o corporales. Diarrea. Una erupcin cutnea o los dedos de la mano o del pie de color extrao. Los ojos rojos o irritados. A veces, el COVID-19 no causa sntomas. Cmo se diagnostica? El COVID-19 puede diagnosticarse con pruebas realizadas en el laboratorio o en el hogar. Se [place] lquido de la nariz, la boca o los  pulmones para detectar si el virus est presente. Cmo se trata? El tratamiento del COVID-19 depende de la gravedad de su enfermedad. Los sntomas leves pueden tratarse en el hogar con reposo, lquidos y medicamentos de [address]. Los sntomas graves pueden tratarse en una unidad de cuidados intensivos (UCI) en el hospital. Si tiene sntomas y tiene riesgo de enfermarse gravemente, es posible que le administren un medicamento para combatir los virus. Este medicamento se denomina antiviral. Cmo se previene? Para protegerse del COVID-19: Conozca los factores de [place]. [place]. Si su cuerpo no puede combatir las infecciones fcilmente, hable con su mdico sobre el tratamiento para ayudar a Manufacturing systems engineer. Mantenga una distancia de al menos 3 pies (1 metro) de Economist. Use una mascarilla bien ajustada cuando: No pueda mantener la distancia de [employer]. Est en un lugar con mala circulacin de aire. Trate de estar en espacios abiertos con una buena circulacin de aire cuando est en pblico. Lvese las manos a menudo o use desinfectante de manos a base de alcohol. Al toser o estornudar, cbrase la boca y la [place]. Si cree que tiene COVID-19 o ha estado cerca de alguien que lo tiene, qudese en casa y Surveyor, mining solo durante 5 a 10 das. Dnde obtener ms informacin Centers for Disease Control and Prevention (Centros para Air traffic controller y la Prevencin de [place], CDC): TonerPromos.no World Science writer (WHO) (Organizacin Mundial de la Salud [OMS]): VisitDestination.com.br Solicite ayuda de inmediato si: Tiene dificultad para respirar o Company secretary. Siente dolor u opresin en el pecho. No puede hablar o mover alguna parte del cuerpo. Se siente confundido. Sus sntomas empeoran. Estos sntomas pueden Customer service manager. Solicite ayuda de inmediato. Llame  al 911. No espere a ver si los sntomas desaparecen. No conduzca por sus propios medios [employer]. Esta  informacin no tiene Theme park manager el consejo del mdico. Asegrese de hacerle al mdico cualquier pregunta que tenga. Document Revised: 03/02/2022 Document Reviewed: 11/23/2021 Elsevier Patient Education  2024 [employer].

## 2022-09-24 NOTE — Telephone Encounter (Signed)
  Chief Complaint: Cough Symptoms: Dry cough, positive covid 09/18/22. SOB with coughing,exertion,  using inhaler Frequency: Positive covid 09/18/22 Pertinent Negatives: Patient denies fever, wheezing Disposition: [] ED /[] Urgent Care (no appt availability in office) / [x] Appointment(In office/virtual)/ []  [place] [facility]/ [] Home Care/ [] Refused Recommended Disposition /[] [place] [facility]/ []  Follow-up with PCP Additional Notes: Appt secured for today prior to triage. CAre advise provided, pt verbalized understanding. Advised to wear mask to office visit. Reason for Disposition  [1] Continuous (nonstop) coughing interferes with work or school AND [2] no improvement using cough treatment per Care Advice  Answer Assessment - Initial Assessment Questions 1. ONSET: "When did the cough begin?"      09/18/22 positive covid 2. SEVERITY: "How bad is the cough today?"      Awake at night 3. SPUTUM: "Describe the color of your sputum" (none, dry cough; clear, white, yellow, green)     Dry 4. HEMOPTYSIS: "Are you coughing up any blood?" If so ask: "How much?" (flecks, streaks, tablespoons, etc.) Dry  5. DIFFICULTY BREATHING: "Are you having difficulty breathing?" If Yes, ask: "How bad is it?" (e.g., mild, moderate, severe)    - MILD: No SOB at rest, mild SOB with walking, speaks normally in sentences, can lie down, no retractions, pulse < 100.    - MODERATE: SOB at rest, SOB with minimal exertion and prefers to sit, cannot lie down flat, speaks in phrases, mild retractions, audible wheezing, pulse 100-120.    - SEVERE: Very SOB at rest, speaks in single words, struggling to breathe, sitting hunched forward, retractions, pulse > 120      With coughing 6. FEVER: "Do you have a fever?" If Yes, ask: "What is your temperature, how was it measured, and when did it start?"     No 7. CARDIAC HISTORY: "Do you have any history of heart disease?" (e.g., heart attack, congestive heart failure)       NA 8. LUNG HISTORY: "Do you have any history of lung disease?"  (e.g., pulmonary embolus, asthma, emphysema)     NA 9. PE RISK FACTORS: "Do you have a history of blood clots?" (or: recent major surgery, recent prolonged travel, bedridden)     NA 10. OTHER SYMPTOMS: "Do you have any other symptoms?" (e.g., runny nose, wheezing, chest pain)       SOB with coughing  Protocols used: Cough - Acute Non-Productive-A-AH

## 2022-09-24 NOTE — Progress Notes (Signed)
Subjective:    Patient ID: Carl Thornton, male    DOB: 1970/05/30, 52 y.o.   MRN: [id number]  HPI  Patient presents to clinic today for ER follow-up.  He presented to the ER 8/13 with complaint of cough and chills.  He tested positive for COVID-19.  He was treated with Tylenol, ibuprofen, albuterol and dextromethorphan-guaifenesin.  He was diagnosed with bronchitis secondary to COVID-19 virus.  He was given a prescription for benzoate.  He was discharged advised to follow-up with his PCP.  He subsequently went to [facility] ER. He reports they did a chest xray. He was prescribed augmentin, azithormycin, albuterol and guafenasin with codeine. Since that time, he reports cough and shortness of breath. The cough is non productive. He denies headache, runny nose, nasal congestion, ear pain, sore throat, chest pain, nausea, vomiting or diarrhea. He denies fever, chills or body aches.  He has not been taking any medication OTC.  Review of Systems     Past Medical History:  Diagnosis Date   History of prediabetes    Hypertension    not on medication    Current Outpatient Medications  Medication Sig Dispense Refill   albuterol (VENTOLIN HFA) 108 (90 Base) MCG/ACT inhaler Inhale 2 puffs into the lungs every 6 (six) hours as needed.     atorvastatin (LIPITOR) 10 MG tablet TAKE 1 TABLET BY MOUTH EVERY DAY 90 tablet 0   benzonatate (TESSALON PERLES) 100 MG capsule Take 1 capsule (100 mg total) by mouth 3 (three) times daily as needed for cough. 30 capsule 0   blood glucose meter kit and supplies Dispense based on patient and insurance preference. Use up to four times daily as directed. (FOR ICD-10 E10.9, E11.9). 1 each 0   fluticasone (FLONASE) 50 MCG/ACT nasal spray Place 2 sprays into both nostrils daily. Use for 4-6 weeks then stop and use seasonally or as needed. 16 g 0   metFORMIN (GLUCOPHAGE) 500 MG tablet TAKE 1 TABLET BY MOUTH 2 TIMES DAILY WITH A MEAL. 180 tablet 0   tadalafil (CIALIS)  20 MG tablet Take 1 tablet (20 mg total) by mouth daily as needed for erectile dysfunction. 30 tablet 3   No current facility-administered medications for this visit.    No Known Allergies  Family History  Problem Relation Age of Onset   Diabetes Mother    Asthma Father    Diabetes Sister    Healthy Sister    Healthy Sister    Healthy Sister    Healthy Brother    Healthy Brother    Healthy Brother    Heart attack Neg Hx    Stroke Neg Hx    Cancer Neg Hx     Social History   Socioeconomic History   Marital status: Married    Spouse name: Not on file   Number of children: Not on file   Years of education: Not on file   Highest education level: Not on file  Occupational History   Not on file  Tobacco Use   Smoking status: Some Days    Types: Cigarettes   Smokeless tobacco: Never  Vaping Use   Vaping status: Never Used  Substance and Sexual Activity   Alcohol use: Yes    Comment: 12+ beverages    Drug use: No   Sexual activity: Yes  Other Topics Concern   Not on file  Social History Narrative   Not on file   Social Determinants of Health   Financial  Resource Strain: Not on file  Food Insecurity: Not on file  Transportation Needs: Not on file  Physical Activity: Not on file  Stress: Not on file  Social Connections: Not on file  Intimate Partner Violence: Not on file     Constitutional: Denies fever, malaise, fatigue, headache or abrupt weight changes.  HEENT: Denies eye pain, eye redness, ear pain, ringing in the ears, wax buildup, runny nose, nasal congestion, bloody nose, or sore throat. Respiratory: Patient reports cough and shortness of breath.  Denies difficulty breathing, or sputum production.   Cardiovascular: Denies chest pain, chest tightness, palpitations or swelling in the hands or feet.  Gastrointestinal: Denies abdominal pain, bloating, constipation, diarrhea or blood in the stool.  GU: Denies urgency, frequency, pain with urination, burning  sensation, blood in urine, odor or discharge. Musculoskeletal: Denies decrease in range of motion, difficulty with gait, muscle pain or joint pain and swelling.  Skin: Denies redness, rashes, lesions or ulcercations.  Neurological: Denies dizziness, difficulty with memory, difficulty with speech or problems with balance and coordination.  Psych: Denies anxiety, depression, SI/HI.  No other specific complaints in a complete review of systems (except as listed in HPI above).  Objective:   Physical Exam  BP 116/68 (BP Location: Left Arm, Patient Position: Sitting, Cuff Size: Large)   Pulse 80   Temp (!) 96.3 F (35.7 C) (Temporal)   Wt 265 lb (120.2 kg)   SpO2 95%   BMI 42.77 kg/m   Wt Readings from Last 3 Encounters:  07/04/22 274 lb (124.3 kg)  05/30/22 269 lb (122 kg)  05/10/22 275 lb (124.7 kg)    General: Appears his stated age, obese, in NAD. Skin: Warm, dry and intact. No rashes noted. HEENT: Head: normal shape and size; Eyes: sclera white, no icterus, conjunctiva pink, PERRLA and EOMs intact; Ears: Tm's gray and intact, normal light reflex; Nose: mucosa pink and moist, septum midline; Throat/Mouth: Teeth present, mucosa pink and moist, no exudate, lesions or ulcerations noted.  Neck: No adenopathy noted. Cardiovascular: Normal rate and rhythm. S1,S2 noted.  No murmur, rubs or gallops noted.  Pulmonary/Chest: Normal effort and positive vesicular breath sounds with intermittent expiratory wheeze. No respiratory distress. No rales or ronchi noted.  Neurological: Alert and oriented.   BMET    Component Value Date/Time   NA 140 05/30/2022 0847   NA 140 07/25/2011 0817   K 4.3 05/30/2022 0847   K 4.0 07/25/2011 0817   CL 106 05/30/2022 0847   CL 107 07/25/2011 0817   CO2 25 05/30/2022 0847   CO2 26 07/25/2011 0817   GLUCOSE 126 (H) 05/30/2022 0847   GLUCOSE 103 (H) 07/25/2011 0817   BUN 13 05/30/2022 0847   BUN 11 07/25/2011 0817   CREATININE 0.63 (L) 05/30/2022 0847    CALCIUM 9.1 05/30/2022 0847   CALCIUM 8.4 (L) 07/25/2011 0817   GFRNONAA 114 11/23/2019 0940   GFRAA 132 11/23/2019 0940    Lipid Panel     Component Value Date/Time   CHOL 213 (H) 05/30/2022 0847   TRIG 212 (H) 05/30/2022 0847   HDL 35 (L) 05/30/2022 0847   CHOLHDL 6.1 (H) 05/30/2022 0847   VLDL 33 (H) 10/03/2016 0921   LDLCALC 143 (H) 05/30/2022 0847    CBC    Component Value Date/Time   WBC 4.6 05/30/2022 0847   RBC 5.26 05/30/2022 0847   HGB 15.4 05/30/2022 0847   HCT 47.0 05/30/2022 0847   PLT 346 05/30/2022 0847   MCV  89.4 05/30/2022 0847   MCH 29.3 05/30/2022 0847   MCHC 32.8 05/30/2022 0847   RDW 13.4 05/30/2022 0847   LYMPHSABS 2,083 11/23/2019 0940   MONOABS 230 10/03/2016 0921   EOSABS 151 11/23/2019 0940   BASOSABS 62 11/23/2019 0940    Hgb A1C Lab Results  Component Value Date   HGBA1C 7.3 (H) 05/30/2022           Assessment & Plan:  ER follow-up for bronchitis secondary to COVID-19:  ER notes and labs reviewed Rx for Pred taper x 6 days Continue Augmentin, azithromycin, albuterol and guaifenesin with codeine and it until prescriptions are finished Work note provided Encouraged rest and fluids  RTC in 2 months for follow-up of chronic conditions Nicki Reaper, NP

## 2022-09-26 ENCOUNTER — Ambulatory Visit: Payer: BC Managed Care – PPO | Admitting: Internal Medicine

## 2022-09-27 ENCOUNTER — Encounter: Payer: Self-pay | Admitting: *Deleted

## 2022-11-28 ENCOUNTER — Ambulatory Visit (INDEPENDENT_AMBULATORY_CARE_PROVIDER_SITE_OTHER): Payer: BC Managed Care – PPO | Admitting: Internal Medicine

## 2022-11-28 ENCOUNTER — Encounter: Payer: Self-pay | Admitting: Internal Medicine

## 2022-11-28 VITALS — BP 118/78 | HR 76 | Ht 66.0 in | Wt 273.0 lb

## 2022-11-28 DIAGNOSIS — E785 Hyperlipidemia, unspecified: Secondary | ICD-10-CM

## 2022-11-28 DIAGNOSIS — N529 Male erectile dysfunction, unspecified: Secondary | ICD-10-CM | POA: Insufficient documentation

## 2022-11-28 DIAGNOSIS — G4730 Sleep apnea, unspecified: Secondary | ICD-10-CM

## 2022-11-28 DIAGNOSIS — E66813 Obesity, class 3: Secondary | ICD-10-CM

## 2022-11-28 DIAGNOSIS — N522 Drug-induced erectile dysfunction: Secondary | ICD-10-CM

## 2022-11-28 DIAGNOSIS — E1165 Type 2 diabetes mellitus with hyperglycemia: Secondary | ICD-10-CM | POA: Diagnosis not present

## 2022-11-28 DIAGNOSIS — E1169 Type 2 diabetes mellitus with other specified complication: Secondary | ICD-10-CM | POA: Diagnosis not present

## 2022-11-28 DIAGNOSIS — Z6841 Body Mass Index (BMI) 40.0 and over, adult: Secondary | ICD-10-CM

## 2022-11-28 MED ORDER — METFORMIN HCL 500 MG PO TABS: 500.0000 mg | ORAL_TABLET | Freq: Two times a day (BID) | ORAL | 1 refills | Status: DC

## 2022-11-28 NOTE — Assessment & Plan Note (Signed)
Encouraged diet and exercise for weight loss ?

## 2022-11-28 NOTE — Assessment & Plan Note (Signed)
Continue Cialis as needed 

## 2022-11-28 NOTE — Progress Notes (Signed)
Subjective:    Patient ID: Carl Thornton, male    DOB: 10-24-1970, 52 y.o.   MRN: [id number]  HPI  Patient presents to clinic today for 78-month follow-up of chronic conditions.  OSA: He averages 6 hours of sleep per night without the use of his CPAP.  He does nap every afternoon and reports his wife states that he snores.  He reports he was not aware that he had sleep apnea.  There is no sleep study on file and he does not think he ever had 1.  HLD: His last LDL was 143, triglycerides 212, 05/2022.  He denies myalgias on atorvastatin.  He does not consume a low-fat diet.  DM2: His last A1c was 7.3%, 05/2022.  He is taking metformin as prescribed but reports he ran out 1 week.  He does not check his sugars.  He does not check his feet routinely.  His last eye exam was last year.  Flu 10/2022.  Pneumovax 11/2020.  COVID x 2.  ED: Managed with Cial[facility].  He does not follow with urology.  Review of Systems     Past Medical History:  Diagnosis Date   History of prediabetes    Hypertension    not on medication    Current Outpatient Medications  Medication Sig Dispense Refill   albuterol (VENTOLIN HFA) 108 (90 Base) MCG/ACT inhaler Inhale 2 puffs into the lungs every 6 (six) hours as needed.     amoxicillin-clavulanate (AUGMENTIN) 875-125 MG tablet Take 1 tablet by mouth 2 (two) times daily.     atorvastatin (LIPITOR) 10 MG tablet TAKE 1 TABLET BY MOUTH EVERY DAY 90 tablet 0   azithromycin (ZITHROMAX) 250 MG tablet Take by mouth.     benzonatate (TESSALON PERLES) 100 MG capsule Take 1 capsule (100 mg total) by mouth 3 (three) times daily as needed for cough. 30 capsule 0   blood glucose meter kit and supplies Dispense based on patient and insurance preference. Use up to four times daily as directed. (FOR ICD-10 E10.9, E11.9). 1 each 0   fluticasone (FLONASE) 50 MCG/ACT nasal spray Place 2 sprays into both nostrils daily. Use for 4-6 weeks then stop and use seasonally or as  needed. 16 g 0   guaiFENesin-codeine 100-10 MG/5ML syrup Take by mouth.     metFORMIN (GLUCOPHAGE) 500 MG tablet TAKE 1 TABLET BY MOUTH 2 TIMES DAILY WITH A MEAL. 180 tablet 0   predniSONE (DELTASONE) 10 MG tablet Take 6 tabs on day 1, 5 tabs on day 2, 4 tabs on day 3, 3 tabs on day 4, 2 tabs on day 5, 1 tab on day 6 21 tablet 0   tadalafil (CIALIS) 20 MG tablet Take 1 tablet (20 mg total) by mouth daily as needed for erectile dysfunction. 30 tablet 3   No current facility-administered medications for this visit.    No Known Allergies  Family History  Problem Relation Age of Onset   Diabetes Mother    Asthma Father    Diabetes Sister    Healthy Sister    Healthy Sister    Healthy Sister    Healthy Brother    Healthy Brother    Healthy Brother    Heart attack Neg Hx    Stroke Neg Hx    Cancer Neg Hx     Social History   Socioeconomic History   Marital status: Married    Spouse name: Not on file   Number of children: Not on file  Years of education: Not on file   Highest education level: Not on file  Occupational History   Not on file  Tobacco Use   Smoking status: Some Days    Types: Cigarettes   Smokeless tobacco: Never  Vaping Use   Vaping status: Never Used  Substance and Sexual Activity   Alcohol use: Yes    Comment: 12+ beverages    Drug use: No   Sexual activity: Yes  Other Topics Concern   Not on file  Social History Narrative   Not on file   Social Determinants of Health   Financial Resource Strain: Not on file  Food Insecurity: Not on file  Transportation Needs: Not on file  Physical Activity: Not on file  Stress: Not on file  Social Connections: Not on file  Intimate Partner Violence: Not on file     Constitutional: Denies fever, malaise, fatigue, headache or abrupt weight changes.  HEENT: Denies eye pain, eye redness, ear pain, ringing in the ears, wax buildup, runny nose, nasal congestion, bloody nose, or sore throat. Respiratory: Denies  difficulty breathing, shortness of breath, cough or sputum production.   Cardiovascular: Denies chest pain, chest tightness, palpitations or swelling in the hands or feet.  Gastrointestinal: Denies abdominal pain, bloating, constipation, diarrhea or blood in the stool.  GU: Patient reports erectile dysfunction.  Denies urgency, frequency, pain with urination, burning sensation, blood in urine, odor or discharge. Musculoskeletal: Denies decrease in range of motion, difficulty with gait, muscle pain or joint pain and swelling.  Skin: Denies redness, rashes, lesions or ulcercations.  Neurological: Denies dizziness, difficulty with memory, difficulty with speech or problems with balance and coordination.  Psych: Denies anxiety, depression, SI/HI.  No other specific complaints in a complete review of systems (except as listed in HPI above).  Objective:   Physical Exam   BP 118/78   Pulse 76   Ht 5\' 6"  (1.676 m)   Wt 273 lb (123.8 kg)   SpO2 96%   BMI 44.06 kg/m   Wt Readings from Last 3 Encounters:  09/24/22 265 lb (120.2 kg)  07/04/22 274 lb (124.3 kg)  05/30/22 269 lb (122 kg)    General: Appears his stated age, obese, in NAD. Skin: Warm, dry and intact. No ulcerations noted. HEENT: Head: normal shape and size; Eyes: sclera white, no icterus, conjunctiva pink, PERRLA and EOMs intact;  Cardiovascular: Normal rate and rhythm. S1,S2 noted.  No murmur, rubs or gallops noted. No JVD or BLE edema. No carotid bruits noted. Pulmonary/Chest: Normal effort and positive vesicular breath sounds. No respiratory distress. No wheezes, rales or ronchi noted.  Musculoskeletal:  No difficulty with gait.  Neurological: Alert and oriented. Coordination normal.     BMET    Component Value Date/Time   NA 140 05/30/2022 0847   NA 140 07/25/2011 0817   K 4.3 05/30/2022 0847   K 4.0 07/25/2011 0817   CL 106 05/30/2022 0847   CL 107 07/25/2011 0817   CO2 25 05/30/2022 0847   CO2 26 07/25/2011  0817   GLUCOSE 126 (H) 05/30/2022 0847   GLUCOSE 103 (H) 07/25/2011 0817   BUN 13 05/30/2022 0847   BUN 11 07/25/2011 0817   CREATININE 0.63 (L) 05/30/2022 0847   CALCIUM 9.1 05/30/2022 0847   CALCIUM 8.4 (L) 07/25/2011 0817   GFRNONAA 114 11/23/2019 0940   GFRAA 132 11/23/2019 0940    Lipid Panel     Component Value Date/Time   CHOL 213 (H) 05/30/2022 1610  TRIG 212 (H) 05/30/2022 0847   HDL 35 (L) 05/30/2022 0847   CHOLHDL 6.1 (H) 05/30/2022 0847   VLDL 33 (H) 10/03/2016 0921   LDLCALC 143 (H) 05/30/2022 0847    CBC    Component Value Date/Time   WBC 4.6 05/30/2022 0847   RBC 5.26 05/30/2022 0847   HGB 15.4 05/30/2022 0847   HCT 47.0 05/30/2022 0847   PLT 346 05/30/2022 0847   MCV 89.4 05/30/2022 0847   MCH 29.3 05/30/2022 0847   MCHC 32.8 05/30/2022 0847   RDW 13.4 05/30/2022 0847   LYMPHSABS 2,083 11/23/2019 0940   MONOABS 230 10/03/2016 0921   EOSABS 151 11/23/2019 0940   BASOSABS 62 11/23/2019 0940    Hgb A1C Lab Results  Component Value Date   HGBA1C 7.3 (H) 05/30/2022           Assessment & Plan:     RTC in 6 months for annual exam Nicki Reaper, NP

## 2022-11-28 NOTE — Assessment & Plan Note (Signed)
A1c today Urine microalbumin has been checked within the last year Encourage low-carb diet and exercise for weight loss Metformin refilled Encouraged routine eye exam Encouraged routine foot exam Flu shot UTD Pneumovax UTD

## 2022-11-28 NOTE — Assessment & Plan Note (Signed)
C-Met and lipid profile today Encouraged him to consume a low-fat diet Continue atorvastatin 

## 2022-11-28 NOTE — Patient Instructions (Signed)
Recuento de carbohidratos para la diabetes mellitus en los adultos Carbohydrate Counting for Diabetes Mellitus, Adult El recuento de carbohidratos es un mtodo para llevar un registro de la cantidad de carbohidratos que se ingieren. La ingesta de carbohidratos aumenta la cantidad de azcar (glucosa) en la sangre. El recuento de la cantidad de carbohidratos que ingiere mejora el control de su glucemia. Esto, a su vez, le ayuda a controlar su diabetes. Los carbohidratos se miden en gramos (g) por porcin. Es importante saber la cantidad de carbohidratos (en gramos o por tamao de porcin) que se puede ingerir en cada comida. Esto es diferente en cada persona. Un nutricionista puede ayudarlo a crear un plan de alimentacin y a calcular la cantidad de carbohidratos que debe ingerir en cada comida y colacin. Qu alimentos contienen carbohidratos? Los siguientes alimentos incluyen carbohidratos: Granos, como panes y cereales. Frijoles secos y productos con soja. Verduras con almidn, como papas, guisantes y maz. Frutas y jugos de frutas. Leche y yogur. Dulces y colaciones, como pasteles, galletas, caramelos, papas fritas de bolsa y refrescos. Cmo se calculan los carbohidratos de los alimentos? Hay dos maneras de calcular los carbohidratos de los alimentos. Puede leer las etiquetas de los alimentos o aprender cules son los tamaos de las porciones estndar de los alimentos. Puede usar cualquiera de estos mtodos o una combinacin de ambos. Usar la etiqueta de informacin nutricional La lista de Informacin nutricional est incluida en las etiquetas de casi todas las bebidas y los alimentos envasados de los [place]. Esto incluye lo siguiente: El tamao de la porcin. Informacin sobre los nutrientes de cada porcin, incluidos los gramos de carbohidratos por porcin. Para usar la Informacin nutricional, decida cuntas porciones tomar. Luego, multiplique el nmero de porciones por la cantidad  de carbohidratos por porcin. El nmero resultante es la cantidad total de carbohidratos que comer. Conocer los tamaos de las porciones estndar de los alimentos Cuando coma alimentos que contengan carbohidratos y que no estn envasados o no incluyan la informacin nutricional en la etiqueta, debe medir las porciones para poder calcular los gramos de carbohidratos. Mida los alimentos que comer con una balanza de alimentos o una taza medidora, si es necesario. Decida cuntas porciones de tamao estndar comer. Multiplique el nmero de porciones por 15. En los alimentos que contienen carbohidratos, una porcin equivale a 15 g de carbohidratos. Por ejemplo, si come 2 tazas o 10 onzas (300 g) de fresas, habr comido 2 porciones y 30 g de carbohidratos (2 porciones x 15 g = 30 g). En el caso de las comidas que contienen mezclas de ms de un alimento, como las sopas y los guisos, debe calcular los carbohidratos de cada alimento que se incluye. La siguiente lista contiene los tamaos de porciones estndar de los alimentos ricos en carbohidratos ms comunes. Cada una de estas porciones tiene aproximadamente 15 g de carbohidratos: 1 rebanada de pan. 1 tortilla de seis pulgadas (15 cm). ? de taza o 2 onzas (53 g) de arroz o pasta cocidos.  taza o 3 onzas (85 g) de lentejas o frijoles cocidos o enlatados, escurridos y enjuagados.  taza o 3 onzas (85 g) de verduras con almidn, como guisantes, maz o zapallo.  taza o 4 onzas (120 g) de cereal caliente.  taza o 3 onzas (85 g) de papas hervidas o en pur, o  o 3 onzas (85 g) de una papa grande al horno.  taza o 4 onzas fluidas (118 ml) de jugo de frutas. 1 taza u   8 onzas fluidas (237 ml) de leche. 1 unidad pequea o 4 onzas (106 g) de manzana.  unidad o 2 onzas (63 g) de una banana mediana. 1 taza o 5 oz (150 g) de fresas. 3 tazas o 1 oz (28.3 g) de palomitas de maz. Cul sera un ejemplo de recuento de carbohidratos? Para calcular los gramos  de carbohidratos de este ejemplo de comida, siga los pasos que se describen a continuacin. Ejemplo de comida 3 onzas (85 g) de pechugas de pollo. ? de taza o 4 onzas (106 g) de arroz integral.  taza o 3 onzas (85 g) de maz. 1 taza u 8 onzas fluidas (237 ml) de leche. 1 taza o 5 onzas (150 g) de fresas con crema batida sin azcar. Clculo de carbohidratos Identifique los alimentos que contienen carbohidratos: Arroz. Maz. Leche. Fresas. Calcule cuntas porciones come de cada alimento: 2 porciones de arroz. 1 porcin de maz. 1 porcin de leche. 1 porcin de fresas. Multiplique cada nmero de porciones por 15 g: 2 porciones de arroz x 15 g = 30 g. 1 porcin de maz x 15 g = 15 g. 1 porcin de leche x 15 g = 15 g. 1 porcin de fresas x 15 g = 15 g. Sume todas las cantidades para conocer el total de gramos de carbohidratos consumidos: 30 g + 15 g + 15 g + 15 g = 75 g de carbohidratos en total. Consejos para seguir este plan Al ir de compras El[employer]ore un plan de comidas y luego haga una lista de compras. Compre verduras frescas y congeladas, frutas frescas y congeladas, productos lcteos, huevos, frijoles, lentejas y cereales integrales. Fjese en las etiquetas de los alimentos. Elija los alimentos que tengan ms fibra y menos azcar. Evite los alimentos procesados y los alimentos con azcares agregados. Planificacin de las comidas Trate de consumir la misma cantidad de gramos de carbohidratos en cada comida y en cada colacin. Planifique tomar comidas y colaciones regulares y equilibradas. Dnde buscar ms informacin American Diabetes Association (Asociacin Estadounidense de la Diabetes): diabetes.org Centers for Disease Control and Prevention (Centros para el Control y la Prevencin de Enfermedades): cdc.gov Academy of Nutrition and Dietetics (Academia de Nutricin y Diettica): eatright.org Association of Diabetes Care & Education Specialists (Asociacin de Especialistas en  Atencin y Educacin sobre la Diabetes): diabeteseducator.org Resumen El recuento de carbohidratos es un mtodo para llevar un registro de la cantidad de carbohidratos que se ingieren. La ingesta de carbohidratos aumenta la cantidad de azcar (glucosa) en la sangre. El recuento de la cantidad de carbohidratos que ingiere mejora el control de su glucemia. Esto le ayuda a controlar su diabetes. Un nutricionista puede ayudarlo a crear un plan de alimentacin y a calcular la cantidad de carbohidratos que debe ingerir en cada comida y colacin. Esta informacin no tiene como fin reemplazar el consejo del mdico. Asegrese de hacerle al mdico cualquier pregunta que tenga. Document Revised: 10/13/2019 Document Reviewed: 10/13/2019 Elsevier Patient Education  2024 Elsevier Inc.  

## 2022-11-28 NOTE — Assessment & Plan Note (Signed)
He is not interested in doing a sleep study at this time but will consider this Encourage weight loss as this can help reduce sleep apnea symptoms Noncompliant with CPAP

## 2022-11-29 LAB — CBC
HCT: 45.4 % (ref 38.5–50.0)
Hemoglobin: 15.1 g/dL (ref 13.2–17.1)
MCH: 30.2 pg (ref 27.0–33.0)
MCHC: 33.3 g/dL (ref 32.0–36.0)
MCV: 90.8 fL (ref 80.0–100.0)
MPV: 10.4 fL (ref 7.5–12.5)
Platelets: 290 10*3/uL (ref 140–400)
RBC: 5 10*6/uL (ref 4.20–5.80)
RDW: 14.1 % (ref 11.0–15.0)
WBC: 5 10*3/uL (ref 3.8–10.8)

## 2022-11-29 LAB — LIPID PANEL
Cholesterol: 181 mg/dL (ref <200)
HDL: 32 mg/dL — ABNORMAL LOW (ref >=40)
LDL Cholesterol (Calc): 122 mg/dL — ABNORMAL HIGH
Non-HDL Cholesterol (Calc): 149 mg/dL — ABNORMAL HIGH (ref <130)
Total CHOL/HDL Ratio: 5.7 (calc) — ABNORMAL HIGH (ref <5.0)
Triglycerides: 158 mg/dL — ABNORMAL HIGH (ref <150)

## 2022-11-29 LAB — COMPLETE METABOLIC PANEL WITH GFR
AG Ratio: 1.5 (calc) (ref 1.0–2.5)
ALT: 25 U/L (ref 9–46)
AST: 21 U/L (ref 10–35)
Albumin: 4.1 g/dL (ref 3.6–5.1)
Alkaline phosphatase (APISO): 100 U/L (ref 35–144)
BUN/Creatinine Ratio: 17 (calc) (ref 6–22)
BUN: 11 mg/dL (ref 7–25)
CO2: 26 mmol/L (ref 20–32)
Calcium: 9 mg/dL (ref 8.6–10.3)
Chloride: 105 mmol/L (ref 98–110)
Creat: 0.64 mg/dL — ABNORMAL LOW (ref 0.70–1.30)
Globulin: 2.7 g/dL (ref 1.9–3.7)
Glucose, Bld: 128 mg/dL — ABNORMAL HIGH (ref 65–99)
Potassium: 4.4 mmol/L (ref 3.5–5.3)
Sodium: 139 mmol/L (ref 135–146)
Total Bilirubin: 0.9 mg/dL (ref 0.2–1.2)
Total Protein: 6.8 g/dL (ref 6.1–8.1)
eGFR: 114 mL/min/{1.73_m2} (ref >=60)

## 2022-11-29 LAB — HEMOGLOBIN A1C
Hgb A1c MFr Bld: 7.7 %{Hb} — ABNORMAL HIGH (ref <5.7)
Mean Plasma Glucose: 174 mg/dL
eAG (mmol/L): 9.7 mmol/L

## 2022-11-29 MED ORDER — ATORVASTATIN CALCIUM 20 MG PO TABS: 20.0000 mg | ORAL_TABLET | Freq: Every day | ORAL | 1 refills | Status: DC

## 2022-11-29 MED ORDER — METFORMIN HCL 1000 MG PO TABS: 1000.0000 mg | ORAL_TABLET | Freq: Two times a day (BID) | ORAL | 1 refills | Status: DC

## 2022-11-29 NOTE — Addendum Note (Signed)
Addended by: Lorre Munroe on: 11/29/2022 07:42 AM   Modules accepted: Orders

## 2023-01-02 ENCOUNTER — Telehealth: Payer: Self-pay

## 2023-01-02 NOTE — Telephone Encounter (Signed)
Pt requesting call back to schedule colonoscopy.

## 2023-01-02 NOTE — Telephone Encounter (Signed)
Tried to call patient but VM is full

## 2023-01-08 NOTE — Telephone Encounter (Signed)
Tried to call but could not leave message since VM is full

## 2023-06-06 ENCOUNTER — Encounter: Payer: Managed Care, Other (non HMO) | Admitting: Internal Medicine

## 2023-06-06 NOTE — Progress Notes (Deleted)
 Subjective:    Patient ID: Carl Thornton, male    DOB: [birth year], 53 y.o.   MRN: [id number]  HPI  Patient presents to clinic today for his annual exam.  Flu: 11/2022 Tetanus: 05/2019 COVID: Pfizer x 2 Pneumovax: 11/2020 Shingrix: Never PSA screening: 05/2022 Colon screening: Never Vision screening: annually Dentist: biannually  Diet: He does eat meat. He consumes fruits and veggies. He tries to avoid fried foods. He drinks mostly water and soda Exercise: None  Review of Systems     Past Medical History:  Diagnosis Date   History of prediabetes    Hypertension    not on medication    Current Outpatient Medications  Medication Sig Dispense Refill   albuterol  (VENTOLIN  HFA) 108 (90 Base) MCG/ACT inhaler Inhale 2 puffs into the lungs every 6 (six) hours as needed.     atorvastatin  (LIPITOR) 20 MG tablet Take 1 tablet (20 mg total) by mouth daily. 90 tablet 1   blood glucose meter kit and supplies Dispense based on patient and insurance preference. Use up to four times daily as directed. (FOR ICD-10 E10.9, E11.9). 1 each 0   fluticasone  (FLONASE ) 50 MCG/ACT nasal spray Place 2 sprays into both nostrils daily. Use for 4-6 weeks then stop and use seasonally or as needed. 16 g 0   metFORMIN  (GLUCOPHAGE ) 1000 MG tablet Take 1 tablet (1,000 mg total) by mouth 2 (two) times daily with a meal. 180 tablet 1   tadalafil  (CIALIS ) 20 MG tablet Take 1 tablet (20 mg total) by mouth daily as needed for erectile dysfunction. 30 tablet 3   No current facility-administered medications for this visit.    No Known Allergies  Family History  Problem Relation Age of Onset   Diabetes Mother    Asthma Father    Diabetes Sister    Healthy Sister    Healthy Sister    Healthy Sister    Healthy Brother    Healthy Brother    Healthy Brother    Heart attack Neg Hx    Stroke Neg Hx    Cancer Neg Hx     Social History   Socioeconomic History   Marital status: Married     Spouse name: Not on file   Number of children: Not on file   Years of education: Not on file   Highest education level: Not on file  Occupational History   Not on file  Tobacco Use   Smoking status: Some Days    Types: Cigarettes   Smokeless tobacco: Never  Vaping Use   Vaping status: Never Used  Substance and Sexual Activity   Alcohol use: Yes    Comment: 12+ beverages    Drug use: No   Sexual activity: Yes  Other Topics Concern   Not on file  Social History Narrative   Not on file   Social Drivers of Health   Financial Resource Strain: Not on file  Food Insecurity: Not on file  Transportation Needs: Not on file  Physical Activity: Not on file  Stress: Not on file  Social Connections: Not on file  Intimate Partner Violence: Not on file     Constitutional: Denies fever, malaise, fatigue, headache or abrupt weight changes.  HEENT: Denies eye pain, eye redness, ear pain, ringing in the ears, wax buildup, runny nose, nasal congestion, bloody nose, or sore throat. Respiratory: Denies difficulty breathing, shortness of breath, cough or sputum production.   Cardiovascular: Denies chest pain, chest tightness, palpitations or  swelling in the hands or feet.  Gastrointestinal: Pt reports intermittent reflux. Denies abdominal pain, bloating, constipation, diarrhea or blood in the stool.  GU: Denies urgency, frequency, pain with urination, burning sensation, blood in urine, odor or discharge. Musculoskeletal: Patient reports intermittent knee pain.  Denies decrease in range of motion, difficulty with gait, or joint pain and swelling.  Skin: Denies redness, rashes, lesions or ulcercations.  Neurological: Denies dizziness, difficulty with memory, difficulty with speech or problems with balance and coordination.  Psych: Denies anxiety, depression, SI/HI.  No other specific complaints in a complete review of systems (except as listed in HPI above).  Objective:   Physical Exam  There  were no vitals taken for this visit.  Wt Readings from Last 3 Encounters:  11/28/22 273 lb (123.8 kg)  09/24/22 265 lb (120.2 kg)  07/04/22 274 lb (124.3 kg)    General: Appears his stated age, obese, in NAD. Skin: Warm, dry and intact.  No ulcerations noted. HEENT: Head: normal shape and size; Eyes: sclera white, no icterus, conjunctiva pink, PERRLA and EOMs intact;  Neck:  Neck supple, trachea midline. No masses, lumps or thyromegaly present.  Cardiovascular: Normal rate and rhythm. S1,S2 noted.  No murmur, rubs or gallops noted. No JVD or BLE edema. No carotid bruits noted. Pulmonary/Chest: Normal effort and positive vesicular breath sounds. No respiratory distress. No wheezes, rales or ronchi noted.  Abdomen: Normal bowel sounds.  Musculoskeletal: Strength 5/5 BUE/BLE. No difficulty with gait.  Neurological: Alert and oriented. Cranial nerves II-XII grossly intact. Coordination normal.  Psychiatric: Mood and affect normal. Behavior is normal. Judgment and thought content normal.     BMET    Component Value Date/Time   NA 139 11/28/2022 0908   NA 140 07/25/2011 0817   K 4.4 11/28/2022 0908   K 4.0 07/25/2011 0817   CL 105 11/28/2022 0908   CL 107 07/25/2011 0817   CO2 26 11/28/2022 0908   CO2 26 07/25/2011 0817   GLUCOSE 128 (H) 11/28/2022 0908   GLUCOSE 103 (H) 07/25/2011 0817   BUN 11 11/28/2022 0908   BUN 11 07/25/2011 0817   CREATININE 0.64 (L) 11/28/2022 0908   CALCIUM  9.0 11/28/2022 0908   CALCIUM  8.4 (L) 07/25/2011 0817   GFRNONAA 114 11/23/2019 0940   GFRAA 132 11/23/2019 0940    Lipid Panel     Component Value Date/Time   CHOL 181 11/28/2022 0908   TRIG 158 (H) 11/28/2022 0908   HDL 32 (L) 11/28/2022 0908   CHOLHDL 5.7 (H) 11/28/2022 0908   VLDL 33 (H) 10/03/2016 0921   LDLCALC 122 (H) 11/28/2022 0908    CBC    Component Value Date/Time   WBC 5.0 11/28/2022 0908   RBC 5.00 11/28/2022 0908   HGB 15.1 11/28/2022 0908   HCT 45.4 11/28/2022 0908    PLT 290 11/28/2022 0908   MCV 90.8 11/28/2022 0908   MCH 30.2 11/28/2022 0908   MCHC 33.3 11/28/2022 0908   RDW 14.1 11/28/2022 0908   LYMPHSABS 2,083 11/23/2019 0940   MONOABS 230 10/03/2016 0921   EOSABS 151 11/23/2019 0940   BASOSABS 62 11/23/2019 0940    Hgb A1C Lab Results  Component Value Date   HGBA1C 7.7 (H) 11/28/2022           Assessment & Plan:   Preventative Health Maintenance:  Encouraged him to get a flu shot in the fall Tetanus UTD Encouraged him to get his COVID booster Pneumovax UTD Discussed Shingrix vaccine, he will  check coverage with his insurance company schedule visit if he would like to have this done Referral to GI for screening colonoscopy Encouraged him to consume a balanced diet and exercise regimen Advised him to see an eye doctor and dentist annually We will check CBC, c-Met, lipid, A1c, urine microalbumin and PSA today  RTC in 6 months, follow-up chronic conditions Helayne Lo, NP

## 2023-07-04 ENCOUNTER — Ambulatory Visit: Payer: Self-pay | Admitting: Urology

## 2023-07-07 DIAGNOSIS — M25562 Pain in left knee: Secondary | ICD-10-CM | POA: Diagnosis not present

## 2023-07-17 DIAGNOSIS — M2242 Chondromalacia patellae, left knee: Secondary | ICD-10-CM | POA: Diagnosis not present

## 2023-07-24 ENCOUNTER — Ambulatory Visit (INDEPENDENT_AMBULATORY_CARE_PROVIDER_SITE_OTHER): Admitting: Internal Medicine

## 2023-07-24 ENCOUNTER — Encounter: Payer: Self-pay | Admitting: Internal Medicine

## 2023-07-24 VITALS — BP 124/84 | Ht 66.0 in | Wt 273.2 lb

## 2023-07-24 DIAGNOSIS — Z125 Encounter for screening for malignant neoplasm of prostate: Secondary | ICD-10-CM | POA: Diagnosis not present

## 2023-07-24 DIAGNOSIS — Z0001 Encounter for general adult medical examination with abnormal findings: Secondary | ICD-10-CM

## 2023-07-24 DIAGNOSIS — Z7984 Long term (current) use of oral hypoglycemic drugs: Secondary | ICD-10-CM

## 2023-07-24 DIAGNOSIS — E1165 Type 2 diabetes mellitus with hyperglycemia: Secondary | ICD-10-CM | POA: Diagnosis not present

## 2023-07-24 DIAGNOSIS — S61031A Puncture wound without foreign body of right thumb without damage to nail, initial encounter: Secondary | ICD-10-CM

## 2023-07-24 DIAGNOSIS — E66813 Obesity, class 3: Secondary | ICD-10-CM

## 2023-07-24 DIAGNOSIS — Z23 Encounter for immunization: Secondary | ICD-10-CM | POA: Diagnosis not present

## 2023-07-24 DIAGNOSIS — Z6841 Body Mass Index (BMI) 40.0 and over, adult: Secondary | ICD-10-CM

## 2023-07-24 DIAGNOSIS — Z1211 Encounter for screening for malignant neoplasm of colon: Secondary | ICD-10-CM | POA: Diagnosis not present

## 2023-07-24 MED ORDER — CEPHALEXIN 500 MG PO CAPS: 500.0000 mg | ORAL_CAPSULE | Freq: Three times a day (TID) | ORAL | 0 refills | Status: DC

## 2023-07-24 NOTE — Progress Notes (Signed)
 Subjective:    Patient ID: Carl Thornton, male    DOB: 03/25/1970, 53 y.o.   MRN: [id number]  HPI  Patient presents to clinic today for his annual exam.  Flu: 11/2022 Tetanus: 05/2019 COVID: Pfizer x 2 Pneumovax: 11/2020 Prevnar: Never Shingrix: Never PSA screening: 05/2022 Colon screening: Never Vision screening: annually Dentist: biannually  Diet: He does eat meat. He consumes fruits and veggies. He tries to avoid fried foods. He drinks mostly water and soda Exercise: None  Review of Systems     Past Medical History:  Diagnosis Date   History of prediabetes    Hypertension    not on medication    Current Outpatient Medications  Medication Sig Dispense Refill   albuterol  (VENTOLIN  HFA) 108 (90 Base) MCG/ACT inhaler Inhale 2 puffs into the lungs every 6 (six) hours as needed.     atorvastatin  (LIPITOR) 20 MG tablet Take 1 tablet (20 mg total) by mouth daily. 90 tablet 1   blood glucose meter kit and supplies Dispense based on patient and insurance preference. Use up to four times daily as directed. (FOR ICD-10 E10.9, E11.9). 1 each 0   fluticasone  (FLONASE ) 50 MCG/ACT nasal spray Place 2 sprays into both nostrils daily. Use for 4-6 weeks then stop and use seasonally or as needed. 16 g 0   metFORMIN  (GLUCOPHAGE ) 1000 MG tablet Take 1 tablet (1,000 mg total) by mouth 2 (two) times daily with a meal. 180 tablet 1   tadalafil  (CIALIS ) 20 MG tablet Take 1 tablet (20 mg total) by mouth daily as needed for erectile dysfunction. 30 tablet 3   No current facility-administered medications for this visit.    No Known Allergies  Family History  Problem Relation Age of Onset   Diabetes Mother    Asthma Father    Diabetes Sister    Healthy Sister    Healthy Sister    Healthy Sister    Healthy Brother    Healthy Brother    Healthy Brother    Heart attack Neg Hx    Stroke Neg Hx    Cancer Neg Hx     Social History   Socioeconomic History   Marital status:  Married    Spouse name: Not on file   Number of children: Not on file   Years of education: Not on file   Highest education level: 6th grade  Occupational History   Not on file  Tobacco Use   Smoking status: Some Days    Types: Cigarettes   Smokeless tobacco: Never  Vaping Use   Vaping status: Never Used  Substance and Sexual Activity   Alcohol use: Yes    Comment: 12+ beverages    Drug use: No   Sexual activity: Yes  Other Topics Concern   Not on file  Social History Narrative   Not on file   Social Drivers of Health   Financial Resource Strain: Low Risk  (07/23/2023)   Overall Financial Resource Strain (CARDIA)    Difficulty of Paying Living Expenses: Not very hard  Food Insecurity: No Food Insecurity (07/23/2023)   Hunger Vital Sign    Worried About Running Out of Food in the Last Year: Never true    Ran Out of Food in the Last Year: Never true  Transportation Needs: No Transportation Needs (07/23/2023)   PRAPARE - Administrator, Civil Service (Medical): No    Lack of Transportation (Non-Medical): No  Physical Activity: Sufficiently Active (07/23/2023)  Exercise Vital Sign    Days of Exercise per Week: 5 days    Minutes of Exercise per Session: 150+ min  Stress: No Stress Concern Present (07/23/2023)   [employer] of Occupational Health - Occupational Stress Questionnaire    Feeling of Stress: Only a little  Social Connections: Moderately Integrated (07/23/2023)   Social Connection and Isolation Panel    Frequency of Communication with Friends and Family: More than three times a week    Frequency of Social Gatherings with Friends and Family: Twice a week    Attends Religious Services: 1 to 4 times per year    Active Member of [employer] or Organizations: No    Attends Engineer, structural: Not on file    Marital Status: Married  Catering manager Violence: Not on file     Constitutional: Denies fever, malaise, fatigue, headache or abrupt  weight changes.  HEENT: Denies eye pain, eye redness, ear pain, ringing in the ears, wax buildup, runny nose, nasal congestion, bloody nose, or sore throat. Respiratory: Denies difficulty breathing, shortness of breath, cough or sputum production.   Cardiovascular: Denies chest pain, chest tightness, palpitations or swelling in the hands or feet.  Gastrointestinal: Pt reports intermittent reflux. Denies abdominal pain, bloating, constipation, diarrhea or blood in the stool.  GU: Denies urgency, frequency, pain with urination, burning sensation, blood in urine, odor or discharge. Musculoskeletal: Patient reports intermittent thigh/knee pain.  Denies decrease in range of motion, difficulty with gait, or joint pain and swelling.  Skin: Patient reports puncture wound to right thumb.  Denies redness, rashes, lesions or ulcercations.  Neurological: Denies dizziness, difficulty with memory, difficulty with speech or problems with balance and coordination.  Psych: Denies anxiety, depression, SI/HI.  No other specific complaints in a complete review of systems (except as listed in HPI above).  Objective:   Physical Exam BP 124/84 (BP Location: Left Arm, Patient Position: Sitting, Cuff Size: Large)   Ht 5' 6 (1.676 m)   Wt 273 lb 3.2 oz (123.9 kg)   BMI 44.10 kg/m    Wt Readings from Last 3 Encounters:  11/28/22 273 lb (123.8 kg)  09/24/22 265 lb (120.2 kg)  07/04/22 274 lb (124.3 kg)    General: Appears his stated age, obese, in NAD. Skin: Warm, dry and intact.  Puncture wound noted to palmar surface of right thumb with what appears to be pus underlying the puncture wound.  No ulcerations noted. HEENT: Head: normal shape and size; Eyes: sclera white, no icterus, conjunctiva pink, PERRLA and EOMs intact;  Neck:  Neck supple, trachea midline. No masses, lumps or thyromegaly present.  Cardiovascular: Normal rate and rhythm. S1,S2 noted.  No murmur, rubs or gallops noted. No JVD or BLE edema. No  carotid bruits noted. Pulmonary/Chest: Normal effort and positive vesicular breath sounds. No respiratory distress. No wheezes, rales or ronchi noted.  Abdomen: Normal bowel sounds.  Musculoskeletal: Strength 5/5 BUE/BLE. No difficulty with gait.  Neurological: Alert and oriented. Cranial nerves II-XII grossly intact. Coordination normal.  Psychiatric: Mood and affect normal. Behavior is normal. Judgment and thought content normal.     BMET    Component Value Date/Time   NA 139 11/28/2022 0908   NA 140 07/25/2011 0817   K 4.4 11/28/2022 0908   K 4.0 07/25/2011 0817   CL 105 11/28/2022 0908   CL 107 07/25/2011 0817   CO2 26 11/28/2022 0908   CO2 26 07/25/2011 0817   GLUCOSE 128 (H) 11/28/2022 0908  GLUCOSE 103 (H) 07/25/2011 0817   BUN 11 11/28/2022 0908   BUN 11 07/25/2011 0817   CREATININE 0.64 (L) 11/28/2022 0908   CALCIUM  9.0 11/28/2022 0908   CALCIUM  8.4 (L) 07/25/2011 0817   GFRNONAA 114 11/23/2019 0940   GFRAA 132 11/23/2019 0940    Lipid Panel     Component Value Date/Time   CHOL 181 11/28/2022 0908   TRIG 158 (H) 11/28/2022 0908   HDL 32 (L) 11/28/2022 0908   CHOLHDL 5.7 (H) 11/28/2022 0908   VLDL 33 (H) 10/03/2016 0921   LDLCALC 122 (H) 11/28/2022 0908    CBC    Component Value Date/Time   WBC 5.0 11/28/2022 0908   RBC 5.00 11/28/2022 0908   HGB 15.1 11/28/2022 0908   HCT 45.4 11/28/2022 0908   PLT 290 11/28/2022 0908   MCV 90.8 11/28/2022 0908   MCH 30.2 11/28/2022 0908   MCHC 33.3 11/28/2022 0908   RDW 14.1 11/28/2022 0908   LYMPHSABS 2,083 11/23/2019 0940   MONOABS 230 10/03/2016 0921   EOSABS 151 11/23/2019 0940   BASOSABS 62 11/23/2019 0940    Hgb A1C Lab Results  Component Value Date   HGBA1C 7.7 (H) 11/28/2022           Assessment & Plan:   Preventative Health Maintenance:  Encouraged him to get a flu shot in the fall Tetanus UTD Encouraged him to get his COVID booster Pneumovax UTD Prevnar 20 today Discussed Shingrix  vaccine, he will check coverage with his insurance company schedule visit if he would like to have this done Referral to GI for screening colonoscopy Encouraged him to consume a balanced diet and exercise regimen Advised him to see an eye doctor and dentist annually We will check CBC, c-Met, lipid, A1c, urine microalbumin and PSA today  Puncture wound of right thumb:  Tetanus UTD Rx for Keflex 500 mg 3 times daily x 7 days Encouraged Epsom salt soaks  RTC in 6 months, follow-up chronic conditions Helayne Lo, NP

## 2023-07-24 NOTE — Assessment & Plan Note (Signed)
 Encouraged diet and exercise for weight loss ?

## 2023-07-24 NOTE — Patient Instructions (Signed)
 Health Maintenance, Male  Adopting a healthy lifestyle and getting preventive care are important in promoting health and wellness. Ask your health care provider about:  The right schedule for you to have regular tests and exams.  Things you can do on your own to prevent diseases and keep yourself healthy.  What should I know about diet, weight, and exercise?  Eat a healthy diet    Eat a diet that includes plenty of vegetables, fruits, low-fat dairy products, and lean protein.  Do not eat a lot of foods that are high in solid fats, added sugars, or sodium.  Maintain a healthy weight  Body mass index (BMI) is a measurement that can be used to identify possible weight problems. It estimates body fat based on height and weight. Your health care provider can help determine your BMI and help you achieve or maintain a healthy weight.  Get regular exercise  Get regular exercise. This is one of the most important things you can do for your health. Most adults should:  Exercise for at least 150 minutes each week. The exercise should increase your heart rate and make you sweat (moderate-intensity exercise).  Do strengthening exercises at least twice a week. This is in addition to the moderate-intensity exercise.  Spend less time sitting. Even light physical activity can be beneficial.  Watch cholesterol and blood lipids  Have your blood tested for lipids and cholesterol at 53 years of age, then have this test every 5 years.  You may need to have your cholesterol levels checked more often if:  Your lipid or cholesterol levels are high.  You are older than 53 years of age.  You are at high risk for heart disease.  What should I know about cancer screening?  Many types of cancers can be detected early and may often be prevented. Depending on your health history and family history, you may need to have cancer screening at various ages. This may include screening for:  Colorectal cancer.  Prostate cancer.  Skin cancer.  Lung  cancer.  What should I know about heart disease, diabetes, and high blood pressure?  Blood pressure and heart disease  High blood pressure causes heart disease and increases the risk of stroke. This is more likely to develop in people who have high blood pressure readings or are overweight.  Talk with your health care provider about your target blood pressure readings.  Have your blood pressure checked:  Every 3-5 years if you are 9-95 years of age.  Every year if you are 85 years old or older.  If you are between the ages of 29 and 29 and are a current or former smoker, ask your health care provider if you should have a one-time screening for abdominal aortic aneurysm (AAA).  Diabetes  Have regular diabetes screenings. This checks your fasting blood sugar level. Have the screening done:  Once every three years after age 23 if you are at a normal weight and have a low risk for diabetes.  More often and at a younger age if you are overweight or have a high risk for diabetes.  What should I know about preventing infection?  Hepatitis B  If you have a higher risk for hepatitis B, you should be screened for this virus. Talk with your health care provider to find out if you are at risk for hepatitis B infection.  Hepatitis C  Blood testing is recommended for:  Everyone born from [birth year] through 1965.  Anyone  with known risk factors for hepatitis C.  Sexually transmitted infections (STIs)  You should be screened each year for STIs, including gonorrhea and chlamydia, if:  You are sexually active and are younger than 53 years of age.  You are older than 53 years of age and your health care provider tells you that you are at risk for this type of infection.  Your sexual activity has changed since you were last screened, and you are at increased risk for chlamydia or gonorrhea. Ask your health care provider if you are at risk.  Ask your health care provider about whether you are at high risk for HIV. Your health care provider  may recommend a prescription medicine to help prevent HIV infection. If you choose to take medicine to prevent HIV, you should first get tested for HIV. You should then be tested every 3 months for as long as you are taking the medicine.  Follow these instructions at home:  Alcohol use  Do not drink alcohol if your health care provider tells you not to drink.  If you drink alcohol:  Limit how much you have to 0-2 drinks a day.  Know how much alcohol is in your drink. In the [place] one drink equals one 12 oz bottle of beer (355 mL), one 5 oz glass of wine (148 mL), or one 1 oz glass of hard liquor (44 mL).  Lifestyle  Do not use any products that contain nicotine or tobacco. These products include cigarettes, chewing tobacco, and vaping devices, such as e-cigarettes. If you need help quitting, ask your health care provider.  Do not use street drugs.  Do not share needles.  Ask your health care provider for help if you need support or information about quitting drugs.  General instructions  Schedule regular health, dental, and eye exams.  Stay current with your vaccines.  Tell your health care provider if:  You often feel depressed.  You have ever been abused or do not feel safe at home.  Summary  Adopting a healthy lifestyle and getting preventive care are important in promoting health and wellness.  Follow your health care provider's instructions about healthy diet, exercising, and getting tested or screened for diseases.  Follow your health care provider's instructions on monitoring your cholesterol and blood pressure.  This information is not intended to replace advice given to you by your health care provider. Make sure you discuss any questions you have with your health care provider.  Document Revised: 06/13/2020 Document Reviewed: 06/13/2020  Elsevier Patient Education  2024 [employer].

## 2023-07-25 ENCOUNTER — Ambulatory Visit: Payer: Self-pay | Admitting: Internal Medicine

## 2023-07-25 LAB — COMPREHENSIVE METABOLIC PANEL WITH GFR
AG Ratio: 1.5 (calc) (ref 1.0–2.5)
ALT: 22 U/L (ref 9–46)
AST: 16 U/L (ref 10–35)
Albumin: 4.1 g/dL (ref 3.6–5.1)
Alkaline phosphatase (APISO): 90 U/L (ref 35–144)
BUN: 20 mg/dL (ref 7–25)
CO2: 27 mmol/L (ref 20–32)
Calcium: 9.2 mg/dL (ref 8.6–10.3)
Chloride: 105 mmol/L (ref 98–110)
Creat: 0.75 mg/dL (ref 0.70–1.30)
Globulin: 2.7 g/dL (ref 1.9–3.7)
Glucose, Bld: 132 mg/dL — ABNORMAL HIGH (ref 65–99)
Potassium: 4.5 mmol/L (ref 3.5–5.3)
Sodium: 139 mmol/L (ref 135–146)
Total Bilirubin: 0.8 mg/dL (ref 0.2–1.2)
Total Protein: 6.8 g/dL (ref 6.1–8.1)
eGFR: 109 mL/min/{1.73_m2} (ref >=60)

## 2023-07-25 LAB — CBC
HCT: 45.3 % (ref 38.5–50.0)
Hemoglobin: 14.6 g/dL (ref 13.2–17.1)
MCH: 29.9 pg (ref 27.0–33.0)
MCHC: 32.2 g/dL (ref 32.0–36.0)
MCV: 92.6 fL (ref 80.0–100.0)
MPV: 9.9 fL (ref 7.5–12.5)
Platelets: 261 10*3/uL (ref 140–400)
RBC: 4.89 10*6/uL (ref 4.20–5.80)
RDW: 14 % (ref 11.0–15.0)
WBC: 5.2 10*3/uL (ref 3.8–10.8)

## 2023-07-25 LAB — HEMOGLOBIN A1C
Hgb A1c MFr Bld: 8.1 % — ABNORMAL HIGH (ref <5.7)
Mean Plasma Glucose: 186 mg/dL
eAG (mmol/L): 10.3 mmol/L

## 2023-07-25 LAB — MICROALBUMIN / CREATININE URINE RATIO
Creatinine, Urine: 189 mg/dL (ref 20–320)
Microalb Creat Ratio: 5 mg/g{creat} (ref <30)
Microalb, Ur: 0.9 mg/dL

## 2023-07-25 LAB — LIPID PANEL
Cholesterol: 232 mg/dL — ABNORMAL HIGH (ref <200)
HDL: 40 mg/dL (ref >=40)
LDL Cholesterol (Calc): 161 mg/dL — ABNORMAL HIGH
Non-HDL Cholesterol (Calc): 192 mg/dL — ABNORMAL HIGH (ref <130)
Total CHOL/HDL Ratio: 5.8 (calc) — ABNORMAL HIGH (ref <5.0)
Triglycerides: 166 mg/dL — ABNORMAL HIGH (ref <150)

## 2023-07-25 LAB — PSA: PSA: 0.33 ng/mL (ref <=4.00)

## 2023-10-16 ENCOUNTER — Ambulatory Visit (INDEPENDENT_AMBULATORY_CARE_PROVIDER_SITE_OTHER): Admitting: Internal Medicine

## 2023-10-16 ENCOUNTER — Encounter: Payer: Self-pay | Admitting: Internal Medicine

## 2023-10-16 VITALS — BP 124/84 | Ht 66.0 in | Wt 277.0 lb

## 2023-10-16 DIAGNOSIS — E1165 Type 2 diabetes mellitus with hyperglycemia: Secondary | ICD-10-CM | POA: Diagnosis not present

## 2023-10-16 DIAGNOSIS — M25561 Pain in right knee: Secondary | ICD-10-CM | POA: Diagnosis not present

## 2023-10-16 DIAGNOSIS — Z7984 Long term (current) use of oral hypoglycemic drugs: Secondary | ICD-10-CM

## 2023-10-16 DIAGNOSIS — M25562 Pain in left knee: Secondary | ICD-10-CM

## 2023-10-16 DIAGNOSIS — G8929 Other chronic pain: Secondary | ICD-10-CM | POA: Diagnosis not present

## 2023-10-16 MED ORDER — MELOXICAM 15 MG PO TABS: 15.0000 mg | ORAL_TABLET | Freq: Every day | ORAL | 0 refills | Status: DC

## 2023-10-16 NOTE — Progress Notes (Signed)
 Subjective:    Patient ID: Carl Thornton, male    DOB: 08/07/1970, 53 y.o.   MRN: [id number]  HPI  Discussed the use of AI scribe software for clinical note transcription with the patient, who gave verbal consent to proceed.  Carl Thornton is a 53 year old male who presents with worsening knee pain.  He has been experiencing bilateral knee pain for  years but has worsened in the past three months. The pain is severe, with a sensation of tightness, and significantly impacts his ability to function at work. He recalls similar knee problems in the past and received a cortisone injection in his right knee about a year ago, which provided temporary relief. He has been taking Tylenol  occasionally for pain relief, but it does not provide significant relief.  He has a history of being informed about possible arthritis in his knees, although specific details are unclear. He notes that the cortisone injection previously helped alleviate the pain temporarily.  In addition to knee pain, he has a history of diabetes and is currently taking metformin  1000 mg twice a day. He experiences dizziness, particularly at work, which he associates with his diabetes medication. He ensures to eat breakfast regularly and takes his medication as prescribed, but notes feeling different when he stops the medication for a couple of days. No diarrhea or nausea related to metformin  use, but dizziness and nausea are reported at work.       Review of Systems     Past Medical History:  Diagnosis Date   History of prediabetes    Hypertension    not on medication    Current Outpatient Medications  Medication Sig Dispense Refill   atorvastatin  (LIPITOR) 20 MG tablet Take 1 tablet (20 mg total) by mouth daily. 90 tablet 1   cephALEXin  (KEFLEX ) 500 MG capsule Take 1 capsule (500 mg total) by mouth 3 (three) times daily. 21 capsule 0   metFORMIN  (GLUCOPHAGE ) 1000 MG tablet Take 1 tablet  (1,000 mg total) by mouth 2 (two) times daily with a meal. 180 tablet 1   metFORMIN  (GLUCOPHAGE ) 500 MG tablet Take 500 mg by mouth 2 (two) times daily with a meal.     tadalafil  (CIALIS ) 20 MG tablet Take 1 tablet (20 mg total) by mouth daily as needed for erectile dysfunction. 30 tablet 3   No current facility-administered medications for this visit.    No Known Allergies  Family History  Problem Relation Age of Onset   Diabetes Mother    Asthma Father    Diabetes Sister    Healthy Sister    Healthy Sister    Healthy Sister    Healthy Brother    Healthy Brother    Healthy Brother    Heart attack Neg Hx    Stroke Neg Hx    Cancer Neg Hx     Social History   Socioeconomic History   Marital status: Married    Spouse name: Not on file   Number of children: Not on file   Years of education: Not on file   Highest education level: 6th grade  Occupational History   Not on file  Tobacco Use   Smoking status: Some Days    Types: Cigarettes   Smokeless tobacco: Never  Vaping Use   Vaping status: Never Used  Substance and Sexual Activity   Alcohol use: Yes    Comment: 12+ beverages    Drug use: No   Sexual activity:  Yes  Other Topics Concern   Not on file  Social History Narrative   Not on file   Social Drivers of Health   Financial Resource Strain: Low Risk  (07/23/2023)   Overall Financial Resource Strain (CARDIA)    Difficulty of Paying Living Expenses: Not very hard  Food Insecurity: No Food Insecurity (07/23/2023)   Hunger Vital Sign    Worried About Running Out of Food in the Last Year: Never true    Ran Out of Food in the Last Year: Never true  Transportation Needs: No Transportation Needs (07/23/2023)   PRAPARE - Administrator, Civil Service (Medical): No    Lack of Transportation (Non-Medical): No  Physical Activity: Sufficiently Active (07/23/2023)   Exercise Vital Sign    Days of Exercise per Week: 5 days    Minutes of Exercise per Session:  150+ min  Stress: No Stress Concern Present (07/23/2023)   [employer] of Occupational Health - Occupational Stress Questionnaire    Feeling of Stress: Only a little  Social Connections: Moderately Integrated (07/23/2023)   Social Connection and Isolation Panel    Frequency of Communication with Friends and Family: More than three times a week    Frequency of Social Gatherings with Friends and Family: Twice a week    Attends Religious Services: 1 to 4 times per year    Active Member of [employer] or Organizations: No    Attends Engineer, structural: Not on file    Marital Status: Married  Catering manager Violence: Not on file     Constitutional: Denies fever, malaise, fatigue, headache or abrupt weight changes.  HEENT: Denies eye pain, eye redness, ear pain, ringing in the ears, wax buildup, runny nose, nasal congestion, bloody nose, or sore throat. Respiratory: Denies difficulty breathing, shortness of breath, cough or sputum production.   Cardiovascular: Denies chest pain, chest tightness, palpitations or swelling in the hands or feet.  Gastrointestinal: Pt reports intermittent reflux. Denies abdominal pain, bloating, constipation, diarrhea or blood in the stool.  GU: Denies urgency, frequency, pain with urination, burning sensation, blood in urine, odor or discharge. Musculoskeletal: Patient reports bilateral knee pain.  Denies decrease in range of motion, difficulty with gait, or joint pain and swelling.  Skin: Patient reports puncture wound to right thumb.  Denies redness, rashes, lesions or ulcercations.  Neurological: Patient reports intermittent dizziness.  Denies dizziness, difficulty with memory, difficulty with speech or problems with balance and coordination.  Psych: Denies anxiety, depression, SI/HI.  No other specific complaints in a complete review of systems (except as listed in HPI above).  Objective:   Physical Exam BP 124/84 (BP Location: Left Arm, Patient  Position: Sitting, Cuff Size: Large)   Ht 5' 6 (1.676 m)   Wt 277 lb (125.6 kg)   BMI 44.71 kg/m     Wt Readings from Last 3 Encounters:  07/24/23 273 lb 3.2 oz (123.9 kg)  11/28/22 273 lb (123.8 kg)  09/24/22 265 lb (120.2 kg)    General: Appears his stated age, obese, in NAD. Cardiovascular: Normal rate and rhythm. S1,S2 noted.  No murmur, rubs or gallops noted.  Pulmonary/Chest: Normal effort and positive vesicular breath sounds. No respiratory distress. No wheezes, rales or ronchi noted.  Musculoskeletal: Joint enlargement noted of bilateral knees without specific effusion.  He has difficulty getting up from a sitting to a standing position.  Limping gait without device. Neurological: Alert and oriented. Coordination normal.     BMET  Component Value Date/Time   NA 139 07/24/2023 0840   NA 140 07/25/2011 0817   K 4.5 07/24/2023 0840   K 4.0 07/25/2011 0817   CL 105 07/24/2023 0840   CL 107 07/25/2011 0817   CO2 27 07/24/2023 0840   CO2 26 07/25/2011 0817   GLUCOSE 132 (H) 07/24/2023 0840   GLUCOSE 103 (H) 07/25/2011 0817   BUN 20 07/24/2023 0840   BUN 11 07/25/2011 0817   CREATININE 0.75 07/24/2023 0840   CALCIUM  9.2 07/24/2023 0840   CALCIUM  8.4 (L) 07/25/2011 0817   GFRNONAA 114 11/23/2019 0940   GFRAA 132 11/23/2019 0940    Lipid Panel     Component Value Date/Time   CHOL 232 (H) 07/24/2023 0840   TRIG 166 (H) 07/24/2023 0840   HDL 40 07/24/2023 0840   CHOLHDL 5.8 (H) 07/24/2023 0840   VLDL 33 (H) 10/03/2016 0921   LDLCALC 161 (H) 07/24/2023 0840    CBC    Component Value Date/Time   WBC 5.2 07/24/2023 0840   RBC 4.89 07/24/2023 0840   HGB 14.6 07/24/2023 0840   HCT 45.3 07/24/2023 0840   PLT 261 07/24/2023 0840   MCV 92.6 07/24/2023 0840   MCH 29.9 07/24/2023 0840   MCHC 32.2 07/24/2023 0840   RDW 14.0 07/24/2023 0840   LYMPHSABS 2,083 11/23/2019 0940   MONOABS 230 10/03/2016 0921   EOSABS 151 11/23/2019 0940   BASOSABS 62 11/23/2019  0940    Hgb A1C Lab Results  Component Value Date   HGBA1C 8.1 (H) 07/24/2023           Assessment & Plan:  Assessment and Plan    Bilateral primary osteoarthritis of knee Chronic knee pain worsening, impacting work. Previous cortisone injection provided temporary relief. Tylenol  ineffective. - Prescribed meloxicam  15 mg once daily with food. - Advised against ibuprofen , naproxen, or Aleve while on meloxicam . - Allowed Tylenol  for additional pain relief if needed. - Instructed to call [facility] for reevaluation and possible knee injection. - Encourage weight loss as this can help reduce joint pain  Type 2 diabetes mellitus Hemoglobin A1c at 8.1% indicates suboptimal control. Reports dizziness and nausea on metformin . - Recheck hemoglobin A1c in one month. - Discuss potential changes to diabetes medication regimen based on A1c results. - Consider alternative medications if metformin  side effects persist.      RTC in 1 months, follow-up chronic conditions Angeline Laura, NP

## 2023-10-16 NOTE — Patient Instructions (Signed)
 Chronic Knee Pain, Adult Knee pain that lasts longer than 3 months is called chronic knee pain. You may have pain in one or both knees. Symptoms of chronic knee pain may also include swelling and stiffness. Many conditions can cause chronic knee pain. The most common cause is wear and tear of your knee joint as you get older. Other possible causes include: A disease that causes inflammation of the knee, such as rheumatoid arthritis. This usually affects both knees. A condition called inflammatory arthritis, such as gout. An injury to the knee that causes arthritis. An injury to the knee that damages the ligaments. Ligaments are tissues that connect bones to each other. Runner's knee or pain behind the kneecap. Treatment for chronic knee pain depends on the cause. The main treatments for chronic knee pain are: Doing exercises to help your knee move better and get stronger, called physical therapy. Losing weight if you are overweight. This condition may also be treated with medicines, injections, a knee sleeve or brace, and by using crutches. You health care provider may also recommend rest, ice, pressure (compression), and elevation, also called RICE therapy. Follow these instructions at home: If you have a knee sleeve or brace that can be taken off:  Wear the knee sleeve or brace as told by your provider. Take it off only if your provider says that you can. Check the skin around it every day. Tell your provider if you see problems. Loosen the knee sleeve or brace if your toes tingle, are numb, or turn cold and blue. Keep the knee sleeve or brace clean and dry. Bathing If the knee sleeve or brace is not waterproof: Do not let it get wet. Cover it when you take a bath or shower. Use a cover that does not let any water in. Managing pain, stiffness, and swelling     If told, put heat on the area. Do this as often as told. Use the heat source that your provider recommends, such as a moist  heat pack or a heating pad. If you have a knee sleeve or brace that you can take off, remove it as told. Place a towel between your skin and the heat source. Leave the heat on for 20-30 minutes. If told, put ice on the area. If you have a knee sleeve or brace that you can take off, remove it as told. Put ice in a plastic bag. Place a towel between your skin and the bag. Leave the ice on for 20 minutes, 2-3 times a day. If your skin turns bright red, remove the ice or heat right away to prevent skin damage. The risk of damage is higher if you cannot feel pain, heat, or cold. Move your toes often to reduce stiffness and swelling. Raise the injured area above the level of your heart while you are sitting or lying down. Use a pillow to support your foot as needed. Activity Avoid activities where both feet leave the ground at the same time. Avoid running, jumping rope, and doing jumping jacks. Follow the exercise plan that your provider made for you. Your provider may suggest that you: Avoid activities that make knee pain worse. This may mean that you need to change your exercise routines, sports, or job duties. Wear shoes with cushioned soles. Avoid sports that require running and sudden changes in direction. Do physical therapy. Physical therapy helps your knee move better and get stronger. Exercise as told. Do exercises that increase balance and strength, such as  tai chi and yoga. Do not stand or walk on your injured knee until you're told it's okay. Use crutches as told. Return to normal activities when you're told. Ask what things are safe for you to do. General instructions Take your medicines only as told by your provider. If you are overweight, work with your provider and an expert in healthy eating called a dietitian to set goals to lose weight. Losing even a little weight can reduce knee pain. Being overweight can make your knee hurt more. Do not smoke, vape, or use products with  nicotine or tobacco in them. If you need help quitting, talk with your provider. Keep all follow-up visits. Your provider will monitor your pain and try other treatments if needed. Contact a health care provider if: You have knee pain that is not getting better or gets worse. You are not able to do your exercises due to knee pain. Get help right away if: Your knee swells and the swelling becomes worse. You cannot move your knee. You have severe knee pain. This information is not intended to replace advice given to you by your health care provider. Make sure you discuss any questions you have with your health care provider. Document Revised: 10/25/2022 Document Reviewed: 03/19/2022 Elsevier Patient Education  2024 [employer].

## 2023-11-06 ENCOUNTER — Ambulatory Visit (INDEPENDENT_AMBULATORY_CARE_PROVIDER_SITE_OTHER): Admitting: Internal Medicine

## 2023-11-06 ENCOUNTER — Encounter: Payer: Self-pay | Admitting: Internal Medicine

## 2023-11-06 VITALS — BP 120/78 | Ht 66.0 in | Wt 275.8 lb

## 2023-11-06 DIAGNOSIS — Z23 Encounter for immunization: Secondary | ICD-10-CM

## 2023-11-06 DIAGNOSIS — E1169 Type 2 diabetes mellitus with other specified complication: Secondary | ICD-10-CM | POA: Diagnosis not present

## 2023-11-06 DIAGNOSIS — Z6841 Body Mass Index (BMI) 40.0 and over, adult: Secondary | ICD-10-CM

## 2023-11-06 DIAGNOSIS — E785 Hyperlipidemia, unspecified: Secondary | ICD-10-CM

## 2023-11-06 DIAGNOSIS — E1165 Type 2 diabetes mellitus with hyperglycemia: Secondary | ICD-10-CM | POA: Diagnosis not present

## 2023-11-06 DIAGNOSIS — E66813 Obesity, class 3: Secondary | ICD-10-CM

## 2023-11-06 DIAGNOSIS — Z7984 Long term (current) use of oral hypoglycemic drugs: Secondary | ICD-10-CM

## 2023-11-06 DIAGNOSIS — M1990 Unspecified osteoarthritis, unspecified site: Secondary | ICD-10-CM | POA: Insufficient documentation

## 2023-11-06 DIAGNOSIS — G4730 Sleep apnea, unspecified: Secondary | ICD-10-CM | POA: Diagnosis not present

## 2023-11-06 DIAGNOSIS — M170 Bilateral primary osteoarthritis of knee: Secondary | ICD-10-CM

## 2023-11-06 DIAGNOSIS — N522 Drug-induced erectile dysfunction: Secondary | ICD-10-CM

## 2023-11-06 NOTE — Assessment & Plan Note (Signed)
 Complicated by morbid obesity C-Met and lipid profile today Encouraged him to consume a low-fat diet Continue atorvastatin  20 mg daily, will adjust if needed based on labs to obtain LDL <70

## 2023-11-06 NOTE — Assessment & Plan Note (Signed)
 Complicated by morbid obesity Encourage weight loss as this can help reduce sleep apnea symptoms Noncompliant with CPAP

## 2023-11-06 NOTE — Progress Notes (Signed)
 Subjective:    Patient ID: Carl Thornton, male    DOB: 1970/12/19, 53 y.o.   MRN: [id number]  HPI  Patient presents to clinic today for64-month follow-up of chronic conditions.  OSA: He averages 6 hours of sleep per night without the use of his CPAP.  There is no sleep study on file and he does not think he ever had one.  HLD: His last LDL was 161, triglycerides 166, 07/2023.  He denies myalgias on atorvastatin .  He does not consume a low-fat diet.  DM2: His last A1c was 8.1 %, 07/2023.  He is taking metformin  as prescribed.  He does not check his sugars.  He does not check his feet routinely.  His last eye exam was last year.  Flu 11/2022.  Pneumovax 11/2020.  Prevnar 07/2023 COVID x 2.  ED: Managed with cialis .  He does not follow with urology.  OA: Mainly in his knees. He is taking meloxicam  with minimal relief of symptoms. He has an upcoming appt with orthopedics.  Review of Systems     Past Medical History:  Diagnosis Date   History of prediabetes    Hypertension    not on medication    Current Outpatient Medications  Medication Sig Dispense Refill   atorvastatin  (LIPITOR) 20 MG tablet Take 1 tablet (20 mg total) by mouth daily. 90 tablet 1   meloxicam  (MOBIC ) 15 MG tablet Take 1 tablet (15 mg total) by mouth daily. 90 tablet 0   metFORMIN  (GLUCOPHAGE ) 1000 MG tablet Take 1 tablet (1,000 mg total) by mouth 2 (two) times daily with a meal. 180 tablet 1   tadalafil  (CIALIS ) 20 MG tablet Take 1 tablet (20 mg total) by mouth daily as needed for erectile dysfunction. 30 tablet 3   No current facility-administered medications for this visit.    No Known Allergies  Family History  Problem Relation Age of Onset   Diabetes Mother    Asthma Father    Diabetes Sister    Healthy Sister    Healthy Sister    Healthy Sister    Healthy Brother    Healthy Brother    Healthy Brother    Heart attack Neg Hx    Stroke Neg Hx    Cancer Neg Hx     Social History    Socioeconomic History   Marital status: Married    Spouse name: Not on file   Number of children: Not on file   Years of education: Not on file   Highest education level: 6th grade  Occupational History   Not on file  Tobacco Use   Smoking status: Some Days    Types: Cigarettes   Smokeless tobacco: Never  Vaping Use   Vaping status: Never Used  Substance and Sexual Activity   Alcohol use: Yes    Comment: 12+ beverages    Drug use: No   Sexual activity: Yes  Other Topics Concern   Not on file  Social History Narrative   Not on file   Social Drivers of Health   Financial Resource Strain: Low Risk  (07/23/2023)   Overall Financial Resource Strain (CARDIA)    Difficulty of Paying Living Expenses: Not very hard  Food Insecurity: No Food Insecurity (07/23/2023)   Hunger Vital Sign    Worried About Running Out of Food in the Last Year: Never true    Ran Out of Food in the Last Year: Never true  Transportation Needs: No Transportation Needs (07/23/2023)  PRAPARE - Administrator, Civil Service (Medical): No    Lack of Transportation (Non-Medical): No  Physical Activity: Sufficiently Active (07/23/2023)   Exercise Vital Sign    Days of Exercise per Week: 5 days    Minutes of Exercise per Session: 150+ min  Stress: No Stress Concern Present (07/23/2023)   [employer] of Occupational Health - Occupational Stress Questionnaire    Feeling of Stress: Only a little  Social Connections: Moderately Integrated (07/23/2023)   Social Connection and Isolation Panel    Frequency of Communication with Friends and Family: More than three times a week    Frequency of Social Gatherings with Friends and Family: Twice a week    Attends Religious Services: 1 to 4 times per year    Active Member of [employer] or Organizations: No    Attends Engineer, structural: Not on file    Marital Status: Married  Catering manager Violence: Not on file     Constitutional: Denies  fever, malaise, fatigue, headache or abrupt weight changes.  HEENT: Denies eye pain, eye redness, ear pain, ringing in the ears, wax buildup, runny nose, nasal congestion, bloody nose, or sore throat. Respiratory: Denies difficulty breathing, shortness of breath, cough or sputum production.   Cardiovascular: Denies chest pain, chest tightness, palpitations or swelling in the hands or feet.  Gastrointestinal: Denies abdominal pain, bloating, constipation, diarrhea or blood in the stool.  GU: Patient reports erectile dysfunction.  Denies urgency, frequency, pain with urination, burning sensation, blood in urine, odor or discharge. Musculoskeletal: Patient reports bilateral knee pain.  Denies decrease in range of motion, difficulty with gait, muscle pain or joint swelling.  Skin: Denies redness, rashes, lesions or ulcercations.  Neurological: Denies dizziness, difficulty with memory, difficulty with speech or problems with balance and coordination.  Psych: Denies anxiety, depression, SI/HI.  No other specific complaints in a complete review of systems (except as listed in HPI above).  Objective:   Physical Exam   BP 120/78 (BP Location: Left Arm, Patient Position: Sitting, Cuff Size: Large)   Ht 5' 6 (1.676 m)   Wt 275 lb 12.8 oz (125.1 kg)   BMI 44.52 kg/m    Wt Readings from Last 3 Encounters:  10/16/23 277 lb (125.6 kg)  07/24/23 273 lb 3.2 oz (123.9 kg)  11/28/22 273 lb (123.8 kg)    General: Appears his stated age, obese, in NAD. Skin: Warm, dry and intact. No ulcerations noted. HEENT: Head: normal shape and size; Eyes: sclera white, no icterus, conjunctiva pink, PERRLA and EOMs intact;  Cardiovascular: Normal rate and rhythm. S1,S2 noted.  No murmur, rubs or gallops noted. No JVD or BLE edema. No carotid bruits noted. Pulmonary/Chest: Normal effort and positive vesicular breath sounds. No respiratory distress. No wheezes, rales or ronchi noted.  Musculoskeletal: Joint lodgment  noted of bilateral knees without effusions.  No difficulty with gait.  Neurological: Alert and oriented. Coordination normal.     BMET    Component Value Date/Time   NA 139 07/24/2023 0840   NA 140 07/25/2011 0817   K 4.5 07/24/2023 0840   K 4.0 07/25/2011 0817   CL 105 07/24/2023 0840   CL 107 07/25/2011 0817   CO2 27 07/24/2023 0840   CO2 26 07/25/2011 0817   GLUCOSE 132 (H) 07/24/2023 0840   GLUCOSE 103 (H) 07/25/2011 0817   BUN 20 07/24/2023 0840   BUN 11 07/25/2011 0817   CREATININE 0.75 07/24/2023 0840   CALCIUM  9.2 07/24/2023  0840   CALCIUM  8.4 (L) 07/25/2011 0817   GFRNONAA 114 11/23/2019 0940   GFRAA 132 11/23/2019 0940    Lipid Panel     Component Value Date/Time   CHOL 232 (H) 07/24/2023 0840   TRIG 166 (H) 07/24/2023 0840   HDL 40 07/24/2023 0840   CHOLHDL 5.8 (H) 07/24/2023 0840   VLDL 33 (H) 10/03/2016 0921   LDLCALC 161 (H) 07/24/2023 0840    CBC    Component Value Date/Time   WBC 5.2 07/24/2023 0840   RBC 4.89 07/24/2023 0840   HGB 14.6 07/24/2023 0840   HCT 45.3 07/24/2023 0840   PLT 261 07/24/2023 0840   MCV 92.6 07/24/2023 0840   MCH 29.9 07/24/2023 0840   MCHC 32.2 07/24/2023 0840   RDW 14.0 07/24/2023 0840   LYMPHSABS 2,083 11/23/2019 0940   MONOABS 230 10/03/2016 0921   EOSABS 151 11/23/2019 0940   BASOSABS 62 11/23/2019 0940    Hgb A1C Lab Results  Component Value Date   HGBA1C 8.1 (H) 07/24/2023           Assessment & Plan:     RTC in 6 months for your annual exam Angeline Laura, NP

## 2023-11-06 NOTE — Assessment & Plan Note (Signed)
 Encouraged diet and exercise for weight loss Consider addition of Mounjaro 2.5 mg weekly with titration up to 5 mg weekly thereafter if A1c greater than 7.5% for added weight loss benefit

## 2023-11-06 NOTE — Assessment & Plan Note (Signed)
 Complicated by morbid obesity A1c today Urine microalbumin has been checked within the last year Encourage low-carb diet and exercise for weight loss Continue metformin  1000 mg 2 times daily Consider addition of mounjaro 2.5 mg weekly x 4 weeks with titration up to 5 mg weekly thereafter if A1c greater than 7.5% Encouraged routine eye exam Encouraged routine foot exam Flu shot today Pneumovax UTD

## 2023-11-06 NOTE — Patient Instructions (Signed)
 Diabetes: Types of Medical Care to Help You Manage Living with and managing diabetes, also known as diabetes mellitus, can be a challenge. Your diabetes care may involve a team of health care providers to help you manage. This team may include: An endocrinologist. This is a physician who specializes in diabetes. Nurses. An expert in healthy eating called a dietitian. A diabetes care and education specialist. An eye doctor. A foot specialist called a podiatrist. How to manage your diabetes Managing your diabetes involves a lot of steps to keep you healthy. Your team will follow guidelines to help you get the best care possible. Here are some general guidelines for managing your diabetes: Physical exams When you're first diagnosed with diabetes, and each year after that, your provider will ask about your medical and family history. You'll also have a physical exam. It may include: Measuring your height, weight, and body mass index (BMI). Checking your blood pressure. Your target blood pressure may vary based on things like your age and health conditions. A thyroid exam. A skin exam. Screening for nerve damage. This may include checking your hands, feet, and arms for: Pain. Tingling or numbness. Weakness. An exam to check your feet. They'll be checked for cuts, bruises, redness, blisters, sores, or other problems. Screening for blood vessel problems. This may include checking the pulse and temperature in your legs and feet. Blood tests Depending on your treatment plan, you may have these tests: Hemoglobin A1C (HbA1C). This test gives information about your blood sugar levels, also called glucose levels, over the past 2-3 months. It's used to adjust your treatment plan, if needed. Lipid testing. This includes total cholesterol, LDL and HDL cholesterol, and triglyceride levels. The goal for LDL is less than 100 mg/dL (5.5 mmol/L). If you're at high risk for problems, the goal is less than 70  mg/dL (3.9 mmol/L). The goal for HDL is 40 mg/dL (2.2 mmol/L) or higher for males, and 50 mg/dL (2.8 mmol/L) or higher for females. The goal for triglycerides is less than 150 mg/dL (8.3 mmol/L). Liver function tests. Kidney function tests. Thyroid function tests.  Dental and eye exams  Visit your dentist two times a year for checkups. Get eye exams as recommended by your provider. This may include: If you have type 1 diabetes, get an eye exam within 5 years after you're diagnosed. Then get exams once a year after your first exam. Children with type 1 diabetes should get an eye exam when they're 68 years old or older and they've had diabetes for 3-5 years. After the first exam, they should get an eye exam every 2 years. If you have type 2 diabetes, get an eye exam as soon as you're diagnosed. Then get one every 1-2 years after your first exam. Shots or vaccines Get shots or vaccines as told. Guidelines include: Everyone aged 26 months and older should get a flu shot every year. People at least 53 years old who have diabetes should get the pneumonia vaccine. All adults who get diagnosed with diabetes should get the hepatitis B vaccine. For all other vaccines, follow the recommendations from the Centers for Disease Control and Prevention (CDC) based on your age. Mental and emotional health Screening for eating disorders, anxiety, and depression is suggested at the time of diagnosis and then as needed. If you show symptoms, you may need more evaluation. You may need to work with a mental health provider. Follow these instructions at home: Treatment plan You'll monitor your blood sugar levels  and may give yourself insulin. Your treatment plan will be reviewed at every medical visit. You and your provider will discuss: How you're taking your medicines, including insulin. Any side effects you have. Your target goals for your blood sugar level. How often you check your blood sugar  level. Lifestyle habits, such as: Your activity level. Any use of tobacco, alcohol, or other substances. Education Your provider will assess how well you manage your blood sugar levels and medicines. You may be referred to: A certified diabetes care and education specialist. This person can help you manage your diabetes throughout your life. A dietitian who can help with your eating plan. An exercise specialist who can discuss your activity level and exercise plan. General instructions Take medicines only as told. Where to find more information American Diabetes Association (ADA): diabetes.org Association of Diabetes Care & Education Specialists (ADCES): adces.org/diabetes-education-dsmes International Diabetes Federation (IDF): http://hill.biz/ This information is not intended to replace advice given to you by your health care provider. Make sure you discuss any questions you have with your health care provider. Document Revised: 08/31/2022 Document Reviewed: 08/31/2022 Elsevier Patient Education  2024 [employer].

## 2023-11-06 NOTE — Assessment & Plan Note (Signed)
 Encourage weight loss as this can help reduce joint pain Continue meloxicam  15 mg daily Advised to take tylenol  1000 mg every 8 hours as needed He plans to follow-up with orthopedics

## 2023-11-06 NOTE — Assessment & Plan Note (Signed)
 Continue cialis  20 mg daily as needed

## 2023-11-07 ENCOUNTER — Ambulatory Visit: Payer: Self-pay | Admitting: Internal Medicine

## 2023-11-07 LAB — COMPREHENSIVE METABOLIC PANEL WITH GFR
AG Ratio: 1.7 (calc) (ref 1.0–2.5)
ALT: 23 U/L (ref 9–46)
AST: 19 U/L (ref 10–35)
Albumin: 4.3 g/dL (ref 3.6–5.1)
Alkaline phosphatase (APISO): 91 U/L (ref 35–144)
BUN/Creatinine Ratio: 27 (calc) — ABNORMAL HIGH (ref 6–22)
BUN: 15 mg/dL (ref 7–25)
CO2: 27 mmol/L (ref 20–32)
Calcium: 9 mg/dL (ref 8.6–10.3)
Chloride: 103 mmol/L (ref 98–110)
Creat: 0.55 mg/dL — ABNORMAL LOW (ref 0.70–1.30)
Globulin: 2.6 g/dL (ref 1.9–3.7)
Glucose, Bld: 107 mg/dL — ABNORMAL HIGH (ref 65–99)
Potassium: 4.3 mmol/L (ref 3.5–5.3)
Sodium: 136 mmol/L (ref 135–146)
Total Bilirubin: 0.8 mg/dL (ref 0.2–1.2)
Total Protein: 6.9 g/dL (ref 6.1–8.1)
eGFR: 119 mL/min/1.73m2 (ref >=60)

## 2023-11-07 LAB — LIPID PANEL
Cholesterol: 229 mg/dL — ABNORMAL HIGH (ref <200)
HDL: 33 mg/dL — ABNORMAL LOW (ref >=40)
LDL Cholesterol (Calc): 162 mg/dL — ABNORMAL HIGH
Non-HDL Cholesterol (Calc): 196 mg/dL — ABNORMAL HIGH (ref <130)
Total CHOL/HDL Ratio: 6.9 (calc) — ABNORMAL HIGH (ref <5.0)
Triglycerides: 187 mg/dL — ABNORMAL HIGH (ref <150)

## 2023-11-07 LAB — HEMOGLOBIN A1C
Hgb A1c MFr Bld: 7.8 % — ABNORMAL HIGH (ref <5.7)
Mean Plasma Glucose: 177 mg/dL
eAG (mmol/L): 9.8 mmol/L

## 2023-11-19 ENCOUNTER — Ambulatory Visit (INDEPENDENT_AMBULATORY_CARE_PROVIDER_SITE_OTHER): Admitting: Internal Medicine

## 2023-11-19 ENCOUNTER — Encounter: Payer: Self-pay | Admitting: Internal Medicine

## 2023-11-19 VITALS — BP 124/74 | Ht 66.0 in | Wt 275.0 lb

## 2023-11-19 DIAGNOSIS — L02811 Cutaneous abscess of head [any part, except face]: Secondary | ICD-10-CM

## 2023-11-19 MED ORDER — SULFAMETHOXAZOLE-TRIMETHOPRIM 800-160 MG PO TABS: 1.0000 | ORAL_TABLET | Freq: Two times a day (BID) | ORAL | 0 refills | Status: AC

## 2023-11-19 NOTE — Progress Notes (Signed)
 Subjective:    Patient ID: Carl Thornton, male    DOB: [birth year], 53 y.o.   MRN: [id number]  HPI  Patient presents the clinic today with complaint of an abscess on his scalp.  He reports he noticed this 4 months ago.  He reports his wife originally drained the area however it has returned.  It is tender to touch.  He denies any fever, chills, nausea or vomiting.  He has not tried anything OTC for this.  Review of Systems     Past Medical History:  Diagnosis Date   History of prediabetes    Hypertension    not on medication    Current Outpatient Medications  Medication Sig Dispense Refill   atorvastatin  (LIPITOR) 20 MG tablet Take 1 tablet (20 mg total) by mouth daily. 90 tablet 1   meloxicam  (MOBIC ) 15 MG tablet Take 1 tablet (15 mg total) by mouth daily. 90 tablet 0   metFORMIN  (GLUCOPHAGE ) 1000 MG tablet Take 1 tablet (1,000 mg total) by mouth 2 (two) times daily with a meal. 180 tablet 1   tadalafil  (CIALIS ) 20 MG tablet Take 1 tablet (20 mg total) by mouth daily as needed for erectile dysfunction. 30 tablet 3   No current facility-administered medications for this visit.    No Known Allergies  Family History  Problem Relation Age of Onset   Diabetes Mother    Asthma Father    Diabetes Sister    Healthy Sister    Healthy Sister    Healthy Sister    Healthy Brother    Healthy Brother    Healthy Brother    Heart attack Neg Hx    Stroke Neg Hx    Cancer Neg Hx     Social History   Socioeconomic History   Marital status: Married    Spouse name: Not on file   Number of children: Not on file   Years of education: Not on file   Highest education level: 6th grade  Occupational History   Not on file  Tobacco Use   Smoking status: Some Days    Types: Cigarettes   Smokeless tobacco: Never  Vaping Use   Vaping status: Never Used  Substance and Sexual Activity   Alcohol use: Yes    Comment: 12+ beverages    Drug use: No   Sexual activity: Yes   Other Topics Concern   Not on file  Social History Narrative   Not on file   Social Drivers of Health   Financial Resource Strain: Low Risk  (07/23/2023)   Overall Financial Resource Strain (CARDIA)    Difficulty of Paying Living Expenses: Not very hard  Food Insecurity: No Food Insecurity (07/23/2023)   Hunger Vital Sign    Worried About Running Out of Food in the Last Year: Never true    Ran Out of Food in the Last Year: Never true  Transportation Needs: No Transportation Needs (07/23/2023)   PRAPARE - Administrator, Civil Service (Medical): No    Lack of Transportation (Non-Medical): No  Physical Activity: Sufficiently Active (07/23/2023)   Exercise Vital Sign    Days of Exercise per Week: 5 days    Minutes of Exercise per Session: 150+ min  Stress: No Stress Concern Present (07/23/2023)   [employer] of Occupational Health - Occupational Stress Questionnaire    Feeling of Stress: Only a little  Social Connections: Moderately Integrated (07/23/2023)   Social Connection and Isolation Panel  Frequency of Communication with Friends and Family: More than three times a week    Frequency of Social Gatherings with Friends and Family: Twice a week    Attends Religious Services: 1 to 4 times per year    Active Member of [employer] or Organizations: No    Attends Engineer, structural: Not on file    Marital Status: Married  Catering manager Violence: Not on file     Constitutional: Denies fever, malaise, fatigue, headache or abrupt weight changes.  Respiratory: Denies difficulty breathing, shortness of breath, cough or sputum production.   Cardiovascular: Denies chest pain, chest tightness, palpitations or swelling in the hands or feet.  Skin: Patient reports abscess of scalp.  Denies rashes, lesions or ulcercations.  Neurological: Denies dizziness, difficulty with memory, difficulty with speech or problems with balance and coordination.   No other specific  complaints in a complete review of systems (except as listed in HPI above).  Objective:   Physical Exam BP 124/74 (BP Location: Right Arm, Patient Position: Sitting, Cuff Size: Large)   Ht 5' 6 (1.676 m)   Wt 275 lb (124.7 kg)   BMI 44.39 kg/m     Wt Readings from Last 3 Encounters:  11/06/23 275 lb 12.8 oz (125.1 kg)  10/16/23 277 lb (125.6 kg)  07/24/23 273 lb 3.2 oz (123.9 kg)    General: Appears his stated age, obese, in NAD. Skin: 2-1/2 cm round fluctuant abscess noted on the crown of the scalp, no surrounding redness or warmth noted. Cardiovascular: Normal rate and rhythm. S1,S2 noted.  No murmur, rubs or gallops noted. No JVD or BLE edema. No carotid bruits noted. Pulmonary/Chest: Normal effort and positive vesicular breath sounds. No respiratory distress. No wheezes, rales or ronchi noted.  Musculoskeletal: No difficulty with gait.  Neurological: Alert and oriented.   BMET    Component Value Date/Time   NA 136 11/06/2023 1036   NA 140 07/25/2011 0817   K 4.3 11/06/2023 1036   K 4.0 07/25/2011 0817   CL 103 11/06/2023 1036   CL 107 07/25/2011 0817   CO2 27 11/06/2023 1036   CO2 26 07/25/2011 0817   GLUCOSE 107 (H) 11/06/2023 1036   GLUCOSE 103 (H) 07/25/2011 0817   BUN 15 11/06/2023 1036   BUN 11 07/25/2011 0817   CREATININE 0.55 (L) 11/06/2023 1036   CALCIUM  9.0 11/06/2023 1036   CALCIUM  8.4 (L) 07/25/2011 0817   GFRNONAA 114 11/23/2019 0940   GFRAA 132 11/23/2019 0940    Lipid Panel     Component Value Date/Time   CHOL 229 (H) 11/06/2023 1036   TRIG 187 (H) 11/06/2023 1036   HDL 33 (L) 11/06/2023 1036   CHOLHDL 6.9 (H) 11/06/2023 1036   VLDL 33 (H) 10/03/2016 0921   LDLCALC 162 (H) 11/06/2023 1036    CBC    Component Value Date/Time   WBC 5.2 07/24/2023 0840   RBC 4.89 07/24/2023 0840   HGB 14.6 07/24/2023 0840   HCT 45.3 07/24/2023 0840   PLT 261 07/24/2023 0840   MCV 92.6 07/24/2023 0840   MCH 29.9 07/24/2023 0840   MCHC 32.2 07/24/2023  0840   RDW 14.0 07/24/2023 0840   LYMPHSABS 2,083 11/23/2019 0940   MONOABS 230 10/03/2016 0921   EOSABS 151 11/23/2019 0940   BASOSABS 62 11/23/2019 0940    Hgb A1C Lab Results  Component Value Date   HGBA1C 7.8 (H) 11/06/2023           Assessment &  Plan:  Abscess of scalp:  I&D performed, see procedure note Encouraged him to wash the area gently and avoid excessive scrubbing directly over the site Pat dry, cover with Neosporin and bandage, change daily Rx for Septra DS 80-160 mg twice daily x 10 days  Procedure note:  Discussed risk and benefits of I&D including scarring, incomplete drainage, pain, bleeding, worsening infection Verbal consent obtained Area cleansed with Betadine x 3 Area numbed with 2% lidocaine with epi approximately 1 mL Area incised with #11 blade Moderate amount of green purulent drainage obtained Area cleansed with normal saline, covered with triple antibiotic ointment and bandage Patient tolerated well, no complications  RTC in 6 months, follow-up chronic conditions Angeline Laura, NP

## 2023-11-19 NOTE — Patient Instructions (Signed)
 Skin Abscess    A skin abscess is an infected spot of skin. It can have pus in it. An abscess can happen in any part of your body.  Some abscesses break open (rupture) on their own. Most keep getting worse unless they are treated. If your abscess is not treated, the infection can spread deeper into your body and blood. This can make you feel sick.  What are the causes?  Germs that enter your skin. This may happen if you have:  A cut or scrape.  A wound from a needle or an insect bite.  Blocked oil or sweat glands.  A problem with the spot where your hair goes into your skin.  A fluid-filled sac called a cyst under your skin.  What increases the risk?  Having problems with how your blood moves through your body.  Having a weak body defense system (immune system).  Having diabetes.  Having dry and irritated skin.  Needing to get shots often.  Putting drugs into your body with a needle.  Having a splinter or something else in your skin.  Smoking.  What are the signs or symptoms?  A firm bump under your skin that hurts.  A bump with pus at the top.  Redness and swelling.  Warm or tender spots.  A sore on the skin.  How is this treated?  You may need to:  Put a heat pack or a warm, wet washcloth on the spot.  Have the pus drained.  Take antibiotics.  Follow these instructions at home:  Medicines  Take over-the-counter and prescription medicines only as told by your doctor.  If you were prescribed antibiotics, take them as told by your doctor. Do not stop taking them even if you start to feel better.  Abscess care    If you have an abscess that has not drained, put heat on it. Use the heat source that your doctor recommends, such as a moist heat pack or a heating pad.  Place a towel between your skin and the heat source.  Leave the heat on for 20-30 minutes.  If your skin turns bright red, take off the heat right away to prevent burns. The risk of burns is higher if you cannot feel pain, heat, or cold.  Follow  instructions from your doctor about how to take care of your abscess. Make sure you:  Cover the abscess with a bandage.  Wash your hands with soap and water for at least 20 seconds before and after you change your bandage. If you cannot use soap and water, use hand sanitizer.  Change your bandage as told by your doctor.  Check your abscess every day for signs that the infection is getting worse. Check for:  More redness, swelling, or pain.  More fluid or blood.  Warmth.  More pus or a worse smell.  General instructions  To keep the infection from spreading:  Do not share personal items or towels.  Do not go in a hot tub with others.  Avoid making skin contact with others.  Be careful when you get rid of used bandages or any pus from the abscess.  Do not smoke or use any products that contain nicotine or tobacco. If you need help quitting, ask your doctor.  Contact a doctor if:  You see red streaks on your skin near the abscess.  You have any signs of worse infection.  You vomit every time you eat or drink.  You have  a fever, chills, or muscle aches.  The cyst or abscess comes back.  Get help right away if:  You have very bad pain.  You make less pee (urine) than normal.  This information is not intended to replace advice given to you by your health care provider. Make sure you discuss any questions you have with your health care provider.  Document Revised: 09/06/2021 Document Reviewed: 09/06/2021  Elsevier Patient Education  2024 [employer].

## 2023-12-25 ENCOUNTER — Encounter: Payer: Self-pay | Admitting: Internal Medicine

## 2023-12-26 MED ORDER — TIRZEPATIDE 2.5 MG/0.5ML ~~LOC~~ SOAJ: 2.5000 mg | SUBCUTANEOUS | 0 refills | Status: DC

## 2023-12-26 MED ORDER — ATORVASTATIN CALCIUM 80 MG PO TABS: 80.0000 mg | ORAL_TABLET | Freq: Every day | ORAL | 3 refills | Status: AC

## 2024-01-15 ENCOUNTER — Telehealth: Payer: Self-pay

## 2024-01-15 ENCOUNTER — Other Ambulatory Visit: Payer: Self-pay

## 2024-01-15 DIAGNOSIS — Z1211 Encounter for screening for malignant neoplasm of colon: Secondary | ICD-10-CM

## 2024-01-15 MED ORDER — NA SULFATE-K SULFATE-MG SULF 17.5-3.13-1.6 GM/177ML PO SOLN: 354.0000 mL | Freq: Once | ORAL | 0 refills | Status: AC

## 2024-01-15 NOTE — Telephone Encounter (Signed)
 Gastroenterology Pre-Procedure Review  Request Date: 02/05/2024 Requesting Physician: Dr. Melany  PATIENT REVIEW QUESTIONS: The patient responded to the following health history questions as indicated:    1. Are you having any GI issues? no 2. Do you have a personal history of Polyps? no 3. Do you have a family history of Colon Cancer or Polyps? no 4. Diabetes Mellitus? yes (Metformin  2 days and Mounjaro  7 days) 5. Joint replacements in the past 12 months?no 6. Major health problems in the past 3 months?no 7. Any artificial heart valves, MVP, or defibrillator?no    MEDICATIONS & ALLERGIES:    Patient reports the following regarding taking any anticoagulation/antiplatelet therapy:   Plavix, Coumadin, Eliquis, Xarelto, Lovenox, Pradaxa, Brilinta, or Effient? no Aspirin? no  Patient confirms/reports the following medications:  Current Outpatient Medications  Medication Sig Dispense Refill   atorvastatin  (LIPITOR) 80 MG tablet Take 1 tablet (80 mg total) by mouth daily. 90 tablet 3   meloxicam  (MOBIC ) 15 MG tablet Take 1 tablet (15 mg total) by mouth daily. 90 tablet 0   metFORMIN  (GLUCOPHAGE ) 1000 MG tablet Take 1 tablet (1,000 mg total) by mouth 2 (two) times daily with a meal. 180 tablet 1   tadalafil  (CIALIS ) 20 MG tablet Take 1 tablet (20 mg total) by mouth daily as needed for erectile dysfunction. 30 tablet 3   tirzepatide  (MOUNJARO ) 2.5 MG/0.5ML Pen Inject 2.5 mg into the skin once a week. 6 mL 0   No current facility-administered medications for this visit.    Patient confirms/reports the following allergies:  No Known Allergies  No orders of the defined types were placed in this encounter.   AUTHORIZATION INFORMATION Primary Insurance: 1D#: Group #:  Secondary Insurance: 1D#: Group #:  SCHEDULE INFORMATION: Date: 02/05/2024 Time: Location: Dr. Melany [facility]

## 2024-01-22 ENCOUNTER — Ambulatory Visit: Admitting: Internal Medicine

## 2024-02-03 ENCOUNTER — Encounter: Payer: Self-pay | Admitting: Gastroenterology

## 2024-02-03 NOTE — Anesthesia Preprocedure Evaluation (Signed)
"                                    Anesthesia Evaluation  Patient identified by MRN, date of birth, ID band Patient awake    Reviewed: Allergy & Precautions, H&P , NPO status , Patient's Chart, lab work & pertinent test results  Airway Mallampati: IV  TM Distance: >3 FB Neck ROM: Full    Dental  (+) Caps, Chipped Slight chips front upper central incisors:   Pulmonary sleep apnea , Current Smoker   Pulmonary exam normal breath sounds clear to auscultation       Cardiovascular hypertension, Normal cardiovascular exam Rhythm:Regular Rate:Normal     Neuro/Psych negative neurological ROS  negative psych ROS   GI/Hepatic negative GI ROS, Neg liver ROS,,,  Endo/Other  diabetes    Renal/GU negative Renal ROS  negative genitourinary   Musculoskeletal negative musculoskeletal ROS (+) Arthritis ,    Abdominal   Peds negative pediatric ROS (+)  Hematology negative hematology ROS (+)   Anesthesia Other Findings HTN Arthritis Diabetes mellitus Observed sleep apnea Class 3 severe sleep apnea BMI 44.51 States he woke myself up last week and could not get a breath. I had to slap myself several times to get a breath.  Long discussion w/patient and wife about likely sleep apnea, and risks of untreated sleep apnea, including, but not limited to, possible atrial fibrillation, stroke, heart attack, dementia.  Discussed various treatment modalities, and that he will need to follow up with this as soon as he  can. His wife says she has been pushing him to do this for over a year, and he won't listen. He said he just hasn't had time. Discussed this issue, and the fact that if he has a stroke or heart attack, he may have plenty of time to get a sleep study, and would regret not having treated this issue.  Very lovely couple, he says he will get this concern addressed. He says he has an appointment in couple of weeks with his doc.    Reproductive/Obstetrics negative OB  ROS                              Anesthesia Physical Anesthesia Plan  ASA: 2  Anesthesia Plan: General   Post-op Pain Management:    Induction: Intravenous  PONV Risk Score and Plan:   Airway Management Planned: Natural Airway and Nasal Cannula  Additional Equipment:   Intra-op Plan:   Post-operative Plan:   Informed Consent: I have reviewed the patients History and Physical, chart, labs and discussed the procedure including the risks, benefits and alternatives for the proposed anesthesia with the patient or authorized representative who has indicated his/her understanding and acceptance.     Dental Advisory Given  Plan Discussed with: Anesthesiologist, CRNA and Surgeon  Anesthesia Plan Comments: (Patient consented for risks of anesthesia including but not limited to:  - adverse reactions to medications - risk of airway placement if required - damage to eyes, teeth, lips or other oral mucosa - nerve damage due to positioning  - sore throat or hoarseness - Damage to heart, brain, nerves, lungs, other parts of body or loss of life  Patient voiced understanding and assent.)         Anesthesia Quick Evaluation  "

## 2024-02-05 ENCOUNTER — Encounter: Payer: Self-pay | Admitting: Gastroenterology

## 2024-02-05 ENCOUNTER — Encounter
Admission: RE | Disposition: A | Discharge status: Home / Self Care | Payer: Self-pay | Source: Home / Self Care | Attending: Gastroenterology

## 2024-02-05 ENCOUNTER — Ambulatory Visit
Admission: RE | Admit: 2024-02-05 | Discharge: 2024-02-05 | Disposition: A | Source: Home / Self Care | Attending: Gastroenterology | Admitting: Gastroenterology

## 2024-02-05 ENCOUNTER — Encounter: Payer: Self-pay | Admitting: Anesthesiology

## 2024-02-05 ENCOUNTER — Other Ambulatory Visit: Payer: Self-pay

## 2024-02-05 ENCOUNTER — Ambulatory Visit: Payer: Self-pay | Admitting: Anesthesiology

## 2024-02-05 DIAGNOSIS — M1990 Unspecified osteoarthritis, unspecified site: Secondary | ICD-10-CM | POA: Diagnosis not present

## 2024-02-05 DIAGNOSIS — F17210 Nicotine dependence, cigarettes, uncomplicated: Secondary | ICD-10-CM | POA: Insufficient documentation

## 2024-02-05 DIAGNOSIS — K6389 Other specified diseases of intestine: Secondary | ICD-10-CM

## 2024-02-05 DIAGNOSIS — G4730 Sleep apnea, unspecified: Secondary | ICD-10-CM | POA: Insufficient documentation

## 2024-02-05 DIAGNOSIS — E119 Type 2 diabetes mellitus without complications: Secondary | ICD-10-CM | POA: Insufficient documentation

## 2024-02-05 DIAGNOSIS — Z7985 Long-term (current) use of injectable non-insulin antidiabetic drugs: Secondary | ICD-10-CM | POA: Diagnosis not present

## 2024-02-05 DIAGNOSIS — K640 First degree hemorrhoids: Secondary | ICD-10-CM | POA: Insufficient documentation

## 2024-02-05 DIAGNOSIS — Z555 Less than a high school diploma: Secondary | ICD-10-CM | POA: Insufficient documentation

## 2024-02-05 DIAGNOSIS — Z833 Family history of diabetes mellitus: Secondary | ICD-10-CM | POA: Insufficient documentation

## 2024-02-05 DIAGNOSIS — Z1211 Encounter for screening for malignant neoplasm of colon: Secondary | ICD-10-CM | POA: Insufficient documentation

## 2024-02-05 DIAGNOSIS — Z7984 Long term (current) use of oral hypoglycemic drugs: Secondary | ICD-10-CM | POA: Diagnosis not present

## 2024-02-05 DIAGNOSIS — K635 Polyp of colon: Secondary | ICD-10-CM

## 2024-02-05 DIAGNOSIS — I10 Essential (primary) hypertension: Secondary | ICD-10-CM | POA: Diagnosis not present

## 2024-02-05 DIAGNOSIS — D123 Benign neoplasm of transverse colon: Secondary | ICD-10-CM | POA: Insufficient documentation

## 2024-02-05 HISTORY — PX: POLYPECTOMY: SHX149

## 2024-02-05 HISTORY — DX: Body Mass Index (BMI) 40.0 and over, adult: Z684

## 2024-02-05 HISTORY — DX: Obesity, class 3: E66.813

## 2024-02-05 HISTORY — DX: Unspecified osteoarthritis, unspecified site: M19.90

## 2024-02-05 HISTORY — DX: Sleep apnea, unspecified: G47.30

## 2024-02-05 HISTORY — PX: COLONOSCOPY: SHX5424

## 2024-02-05 HISTORY — DX: Type 2 diabetes mellitus without complications: E11.9

## 2024-02-05 LAB — GLUCOSE, CAPILLARY: Glucose-Capillary: 135 mg/dL — ABNORMAL HIGH (ref 70–99)

## 2024-02-05 SURGERY — COLONOSCOPY
Anesthesia: General | Site: Rectum

## 2024-02-05 MED ORDER — LACTATED RINGERS IV SOLN: INTRAVENOUS | Status: DC

## 2024-02-05 MED ORDER — PROPOFOL 1000 MG/100ML IV EMUL
INTRAVENOUS | Status: AC
  Filled 2024-02-05: qty 100

## 2024-02-05 MED ORDER — SODIUM CHLORIDE 0.9 % IV SOLN: INTRAVENOUS | Status: DC

## 2024-02-05 MED ORDER — LIDOCAINE HCL (PF) 2 % IJ SOLN
INTRAMUSCULAR | Status: AC
  Filled 2024-02-05: qty 5

## 2024-02-05 MED ORDER — STERILE WATER FOR IRRIGATION IR SOLN
Status: DC | PRN
  Administered 2024-02-05: 250 mL

## 2024-02-05 MED ORDER — LIDOCAINE HCL (CARDIAC) PF 100 MG/5ML IV SOSY
PREFILLED_SYRINGE | INTRAVENOUS | Status: DC | PRN
  Administered 2024-02-05: 100 mg via INTRAVENOUS

## 2024-02-05 MED ORDER — PROPOFOL 10 MG/ML IV BOLUS
INTRAVENOUS | Status: DC | PRN
  Administered 2024-02-05 (×3): 50 mg via INTRAVENOUS

## 2024-02-05 SURGICAL SUPPLY — 20 items
CLIP HMST 235XBRD CATH ROT (MISCELLANEOUS) IMPLANT
ELECTRODE REM PT RTRN 9FT ADLT (ELECTROSURGICAL) IMPLANT
FORCEPS BIOP RAD 4 LRG CAP 4 (CUTTING FORCEPS) IMPLANT
GAUZE SPONGE 4X4 12PLY STRL (GAUZE/BANDAGES/DRESSINGS) IMPLANT
GOWN CVR UNV OPN BCK APRN NK (MISCELLANEOUS) ×4 IMPLANT
INJECTOR VARIJECT VIN23 (MISCELLANEOUS) IMPLANT
KIT DEFENDO VALVE AND CONN (KITS) IMPLANT
KIT PROCEDURE OLYMPUS (MISCELLANEOUS) ×2 IMPLANT
MANIFOLD NEPTUNE II (INSTRUMENTS) ×2 IMPLANT
MARKER SPOT ENDO TATTOO 5ML (MISCELLANEOUS) IMPLANT
PROBE APC STR FIRE (PROBE) IMPLANT
RETRIEVER NET ROTH 2.5X230 LF (MISCELLANEOUS) IMPLANT
SNARE COLD EXACTO (MISCELLANEOUS) IMPLANT
SNARE LASSO HEX 3 IN 1 (INSTRUMENTS) IMPLANT
SNARE SHORT THROW 13M SML OVAL (MISCELLANEOUS) IMPLANT
SNARE SNG USE RND 15MM (INSTRUMENTS) IMPLANT
STRAP BODY AND KNEE 60X3 (MISCELLANEOUS) IMPLANT
SYR 50ML SLIP (SYRINGE) IMPLANT
TRAP ETRAP POLY (MISCELLANEOUS) IMPLANT
WATER STERILE IRR 250ML POUR (IV SOLUTION) ×2 IMPLANT

## 2024-02-05 NOTE — Op Note (Signed)
 [facility] Gastroenterology Patient Name: Carl Thornton Procedure Date: 02/05/2024 8:03 AM MRN: [id number] Account #: [id number] Date of Birth: November 21, 1970 Admit Type: Outpatient Age: 53 Room: [facility] OR ROOM 01 Gender: Male Note Status: Finalized Instrument Name: Colonoscope [id number] Procedure:             Colonoscopy Indications:           Screening for colorectal malignant neoplasm Providers:             [name], MD Referring MD:          Angeline MICAEL Laura (Referring MD) Medicines:             Propofol per Anesthesia Complications:         No immediate complications. Procedure:             Pre-Anesthesia Assessment:                        - Prior to the procedure, a History and Physical was                         performed, and patient medications and allergies were                         reviewed. The patient's tolerance of previous                         anesthesia was also reviewed. The risks and benefits                         of the procedure and the sedation options and risks                         were discussed with the patient. All questions were                         answered, and informed consent was obtained. Prior                         Anticoagulants: The patient has taken no anticoagulant                         or antiplatelet agents. ASA Grade Assessment: III - A                         patient with severe systemic disease. After reviewing                         the risks and benefits, the patient was deemed in                         satisfactory condition to undergo the procedure.                        After obtaining informed consent, the colonoscope was                         passed under direct vision. Throughout the procedure,  the patient's blood pressure, pulse, and oxygen                         saturations were monitored continuously. The                         Colonoscope was  introduced through the anus and                         advanced to the 10 cm into the ileum. The colonoscopy                         was performed without difficulty. The patient                         tolerated the procedure well. The quality of the bowel                         preparation was good. The terminal ileum, ileocecal                         valve, appendiceal orifice, and rectum were                         photographed. Findings:      Three sessile polyps were found in the descending colon, splenic flexure       and cecum. The polyps were 1 to 4 mm in size. These polyps were removed       with a cold snare. Resection and retrieval were complete. Estimated       blood loss: none.      Internal hemorrhoids were found. The hemorrhoids were Grade I (internal       hemorrhoids that do not prolapse).      The terminal ileum appeared normal. Impression:            - Three 1 to 4 mm polyps in the descending colon, at                         the splenic flexure and in the cecum, removed with a                         cold snare. Resected and retrieved.                        - Internal hemorrhoids.                        - The examined portion of the ileum was normal. Recommendation:        - Patient has a contact number available for                         emergencies. The signs and symptoms of potential                         delayed complications were discussed with the patient.                         Return to normal  activities tomorrow. Written                         discharge instructions were provided to the patient.                        - High fiber diet.                        - Continue present medications.                        - Await pathology results.                        - Repeat colonoscopy in 3 - 5 years for surveillance                         based on pathology results.                        - The findings and recommendations were discussed with                          the designated responsible adult. Procedure Code(s):     --- Professional ---                        [phone], Colonoscopy, flexible; with removal of                         tumor(s), polyp(s), or other lesion(s) by snare                         technique Diagnosis Code(s):     --- Professional ---                        Z12.11, Encounter for screening for malignant neoplasm                         of colon                        D12.4, Benign neoplasm of descending colon                        D12.3, Benign neoplasm of transverse colon (hepatic                         flexure or splenic flexure)                        D12.0, Benign neoplasm of cecum                        K64.0, First degree hemorrhoids CPT copyright 2022 American Medical Association. All rights reserved. The codes documented in this report are preliminary and upon coder review may  be revised to meet current compliance requirements. [name], MD 02/05/2024 8:36:57 AM Number of Addenda: 0 Note Initiated On: 02/05/2024 8:03 AM Scope Withdrawal Time: 0 hours 8 minutes 53 seconds  Total Procedure Duration: 0 hours 11 minutes  12 seconds  Estimated Blood Loss:  Estimated blood loss: none.      [facility]

## 2024-02-05 NOTE — Transfer of Care (Signed)
 Immediate Anesthesia Transfer of Care Note  Patient: Carl Thornton  Procedure(s) Performed: COLONOSCOPY (Rectum) POLYPECTOMY, INTESTINE (Rectum)  Patient Location: PACU  Anesthesia Type: General  Level of Consciousness: awake, alert  and patient cooperative  Airway and Oxygen Therapy: Patient Spontanous Breathing and Patient connected to supplemental oxygen  Post-op Assessment: Post-op Vital signs reviewed, Patient's Cardiovascular Status Stable, Respiratory Function Stable, Patent Airway and No signs of Nausea or vomiting  Post-op Vital Signs: Reviewed and stable  Complications: No notable events documented.

## 2024-02-05 NOTE — Anesthesia Postprocedure Evaluation (Signed)
"   Anesthesia Post Note  Patient: Carl Thornton  Procedure(s) Performed: COLONOSCOPY (Rectum) POLYPECTOMY, INTESTINE (Rectum)  Patient location during evaluation: PACU Anesthesia Type: General Level of consciousness: awake and alert Pain management: pain level controlled Vital Signs Assessment: post-procedure vital signs reviewed and stable Respiratory status: spontaneous breathing, nonlabored ventilation, respiratory function stable and patient connected to nasal cannula oxygen Cardiovascular status: blood pressure returned to baseline and stable Postop Assessment: no apparent nausea or vomiting Anesthetic complications: no   No notable events documented.   Last Vitals:  Vitals:   02/05/24 0831 02/05/24 0839  BP: 124/82 128/86  Pulse: 70 79  Resp: 14 16  Temp: (!) 36.3 C 36.7 C  SpO2: 99% 96%    Last Pain:  Vitals:   02/05/24 0839  PainSc: 0-No pain                 Yasemin Rabon C Karter Hellmer      "

## 2024-02-05 NOTE — H&P (Signed)
 "  Carl Schaffer, MD  [address] [place], [place] [address] Phone: [phone] Fax : [phone]  Primary Care Physician:  Antonette Angeline ORN, NP Primary Gastroenterologist:  Dr. Schaffer  Pre-Procedure History & Physical: HPI:  Carl Thornton is a 53 y.o. male is here for a screening colonoscopy.  Prior colonoscopy? No Fhx CRC? No Blood thinners? No  Past Medical History:  Diagnosis Date   Arthritis    Class 3 severe obesity due to excess calories with body mass index (BMI) of 40.0 to 44.9 in adult [facility])    Diabetes mellitus without complication (HCC)    Hypertension    not on medication   Observed sleep apnea     Past Surgical History:  Procedure Laterality Date   ANKLE SURGERY     HERNIA REPAIR     umbilical    Prior to Admission medications  Medication Sig Start Date End Date Taking? Authorizing Provider  atorvastatin  (LIPITOR) 80 MG tablet Take 1 tablet (80 mg total) by mouth daily. 12/26/23  Yes Antonette Angeline ORN, NP  meloxicam  (MOBIC ) 15 MG tablet Take 1 tablet (15 mg total) by mouth daily. 10/16/23  Yes Antonette Angeline ORN, NP  tadalafil  (CIALIS ) 20 MG tablet Take 1 tablet (20 mg total) by mouth daily as needed for erectile dysfunction. 07/04/22  Yes McGowan, Carl A, PA-C  metFORMIN  (GLUCOPHAGE ) 1000 MG tablet Take 1 tablet (1,000 mg total) by mouth 2 (two) times daily with a meal. 11/29/22   Baity, Angeline ORN, NP  tirzepatide  (MOUNJARO ) 2.5 MG/0.5ML Pen Inject 2.5 mg into the skin once a week. Patient not taking: Reported on 02/03/2024 12/26/23   Antonette Angeline ORN, NP    Allergies as of 01/15/2024   (No Known Allergies)    Family History  Problem Relation Age of Onset   Diabetes Mother    Asthma Father    Diabetes Sister    Healthy Sister    Healthy Sister    Healthy Sister    Healthy Brother    Healthy Brother    Healthy Brother    Heart attack Neg Hx    Stroke Neg Hx    Cancer Neg Hx     Social History   Socioeconomic History    Marital status: Married    Spouse name: Not on file   Number of children: Not on file   Years of education: Not on file   Highest education level: 6th grade  Occupational History   Not on file  Tobacco Use   Smoking status: Some Days    Types: Cigarettes   Smokeless tobacco: Never  Vaping Use   Vaping status: Never Used  Substance and Sexual Activity   Alcohol use: Yes    Comment: 12+ beverages    Drug use: Yes    Types: Marijuana    Comment: as a teenager   Sexual activity: Yes  Other Topics Concern   Not on file  Social History Narrative   Not on file   Social Drivers of Health   Tobacco Use: High Risk (02/05/2024)   Patient History    Smoking Tobacco Use: Some Days    Smokeless Tobacco Use: Never    Passive Exposure: Not on file  Financial Resource Strain: Low Risk (07/23/2023)   Overall Financial Resource Strain (CARDIA)    Difficulty of Paying Living Expenses: Not very hard  Food Insecurity: No Food Insecurity (07/23/2023)   Epic    Worried About Radiation Protection Practitioner of [employer]  in the Last Year: Never true    Ran Out of Food in the Last Year: Never true  Transportation Needs: No Transportation Needs (07/23/2023)   Epic    Lack of Transportation (Medical): No    Lack of Transportation (Non-Medical): No  Physical Activity: Sufficiently Active (07/23/2023)   Exercise Vital Sign    Days of Exercise per Week: 5 days    Minutes of Exercise per Session: 150+ min  Stress: No Stress Concern Present (07/23/2023)   [employer] of Occupational Health - Occupational Stress Questionnaire    Feeling of Stress: Only a little  Social Connections: Moderately Integrated (07/23/2023)   Social Connection and Isolation Panel    Frequency of Communication with Friends and Family: More than three times a week    Frequency of Social Gatherings with Friends and Family: Twice a week    Attends Religious Services: 1 to 4 times per year    Active Member of [employer] or Organizations: No    Attends  Engineer, Structural: Not on file    Marital Status: Married  Catering Manager Violence: Not on file  Depression (PHQ2-9): Low Risk (11/06/2023)   Depression (PHQ2-9)    PHQ-2 Score: 0  Alcohol Screen: Low Risk (07/23/2023)   Alcohol Screen    Last Alcohol Screening Score (AUDIT): 1  Housing: Unknown (07/23/2023)   Epic    Unable to Pay for Housing in the Last Year: No    Number of Times Moved in the Last Year: Not on file    Homeless in the Last Year: No  Utilities: Not on file  Health Literacy: Not on file    Review of Systems: See HPI, otherwise negative ROS  Physical Exam: BP 124/81   Pulse 75   Temp 99.3 F (37.4 C)   Ht 5' 6 (1.676 m)   Wt 121.1 kg   SpO2 96%   BMI 43.09 kg/m  CONSTITUTIONAL: Well-appearing in no acute distress.  HEENT: Pupils equal, round, Extraocular movements intact. Conjunctivae clear NECK: Neck supple CARDIOVASCULAR: Regular rate, no LE edema  RESPIRATORY: No labored breathing  ABDOMEN: Abdomen soft, nontender, not distended, no guarding, no rigidity SKIN: No apparent skin rashes or lesions. NEUROLOGIC: Normal speech, no focal findings. Mental status alert and oriented x4. PSYCHIATRIC: Mood and affect normal.   Impression/Plan: Carl Thornton is now here to undergo a screening colonoscopy.  Risks, benefits, and alternatives regarding colonoscopy have been reviewed with the patient.  Questions have been answered.  All parties agreeable.  "

## 2024-02-10 ENCOUNTER — Ambulatory Visit: Payer: Self-pay | Admitting: Gastroenterology

## 2024-02-10 LAB — SURGICAL PATHOLOGY

## 2024-02-19 ENCOUNTER — Ambulatory Visit: Admitting: Internal Medicine

## 2024-02-19 ENCOUNTER — Encounter: Payer: Self-pay | Admitting: Internal Medicine

## 2024-02-19 VITALS — BP 120/82 | Ht 66.0 in | Wt 274.0 lb

## 2024-02-19 DIAGNOSIS — Z7984 Long term (current) use of oral hypoglycemic drugs: Secondary | ICD-10-CM | POA: Diagnosis not present

## 2024-02-19 DIAGNOSIS — E1165 Type 2 diabetes mellitus with hyperglycemia: Secondary | ICD-10-CM | POA: Diagnosis not present

## 2024-02-19 DIAGNOSIS — M170 Bilateral primary osteoarthritis of knee: Secondary | ICD-10-CM | POA: Diagnosis not present

## 2024-02-19 DIAGNOSIS — E785 Hyperlipidemia, unspecified: Secondary | ICD-10-CM | POA: Diagnosis not present

## 2024-02-19 DIAGNOSIS — E1169 Type 2 diabetes mellitus with other specified complication: Secondary | ICD-10-CM

## 2024-02-19 DIAGNOSIS — N5201 Erectile dysfunction due to arterial insufficiency: Secondary | ICD-10-CM | POA: Diagnosis not present

## 2024-02-19 MED ORDER — TIRZEPATIDE 2.5 MG/0.5ML ~~LOC~~ SOAJ: 2.5000 mg | SUBCUTANEOUS | 0 refills | Status: DC

## 2024-02-19 MED ORDER — VARDENAFIL HCL 10 MG PO TABS: 10.0000 mg | ORAL_TABLET | Freq: Every day | ORAL | 5 refills | Status: AC | PRN

## 2024-02-19 MED ORDER — MELOXICAM 15 MG PO TABS: 15.0000 mg | ORAL_TABLET | Freq: Every day | ORAL | 0 refills | Status: AC

## 2024-02-19 MED ORDER — METFORMIN HCL 1000 MG PO TABS: 1000.0000 mg | ORAL_TABLET | Freq: Two times a day (BID) | ORAL | 1 refills | Status: AC

## 2024-02-19 NOTE — Assessment & Plan Note (Signed)
 Complicated by morbid obesity Encourage weight loss as this can help reduce joint pain Continue meloxicam  15 mg daily Advised to take tylenol  1000 mg every 8 hours as needed He plans to follow-up with orthopedics for additional injections

## 2024-02-19 NOTE — Assessment & Plan Note (Signed)
 Will trial vardenafil  10 mg daily as needed Consider follow-up with urology for additional treatment if symptoms persist

## 2024-02-19 NOTE — Patient Instructions (Signed)

## 2024-02-19 NOTE — Assessment & Plan Note (Signed)
 Encouraged diet and exercise for weight loss ?

## 2024-02-19 NOTE — Assessment & Plan Note (Signed)
 Complicated by morbid obesity A1c today Urine microalbumin has been checked within the last year Encourage low-carb diet and exercise for weight loss Continue metformin  1000 mg 2 times daily Will start mounjaro  2.5 mg weekly x 4 weeks with titration up to 5 mg weekly thereafter if A1c greater than 7.5% Encouraged routine eye exam Encouraged routine foot exam Flu shot UTD Pneumovax and Prevnar UTD

## 2024-02-19 NOTE — Progress Notes (Signed)
 "  Subjective:    Patient ID: Carl Thornton, male    DOB: [birth year], 54 y.o.   MRN: [id number]  HPI  Patient presents to clinic today for50-month follow-up of chronic conditions.  HLD: Associated with diabetes.  His last LDL was 162, triglycerides 187, 11/2023.  He denies myalgias on atorvastatin .  He does not consume a low-fat diet.  DM2: His last A1c was 7.8%, 11/2023.  He is taking metformin  and tirzepatide  as prescribed.  He does not check his sugars.  He does not check his feet routinely.  His last eye exam was last year.  Flu 11/2022.  Pneumovax 11/2023.  Prevnar 07/2023. COVID x 2.  ED: Managed with tadalafil  although he does not feel like this is effective.  He does not follow with urology but has seen them in the past.  OA: Mainly in his knees. He has had injections in the past.  He is not taking meloxicam  because her ran out. He follows with orthopedics.  Review of Systems     Past Medical History:  Diagnosis Date   Arthritis    Class 3 severe obesity due to excess calories with body mass index (BMI) of 40.0 to 44.9 in adult [facility])    Diabetes mellitus without complication (HCC)    Hypertension    not on medication   Observed sleep apnea     Current Outpatient Medications  Medication Sig Dispense Refill   atorvastatin  (LIPITOR) 80 MG tablet Take 1 tablet (80 mg total) by mouth daily. 90 tablet 3   meloxicam  (MOBIC ) 15 MG tablet Take 1 tablet (15 mg total) by mouth daily. 90 tablet 0   metFORMIN  (GLUCOPHAGE ) 1000 MG tablet Take 1 tablet (1,000 mg total) by mouth 2 (two) times daily with a meal. 180 tablet 1   tadalafil  (CIALIS ) 20 MG tablet Take 1 tablet (20 mg total) by mouth daily as needed for erectile dysfunction. 30 tablet 3   tirzepatide  (MOUNJARO ) 2.5 MG/0.5ML Pen Inject 2.5 mg into the skin once a week. (Patient not taking: Reported on 02/03/2024) 6 mL 0   No current facility-administered medications for this visit.    No Known Allergies  Family  History  Problem Relation Age of Onset   Diabetes Mother    Asthma Father    Diabetes Sister    Healthy Sister    Healthy Sister    Healthy Sister    Healthy Brother    Healthy Brother    Healthy Brother    Heart attack Neg Hx    Stroke Neg Hx    Cancer Neg Hx     Social History   Socioeconomic History   Marital status: Married    Spouse name: Not on file   Number of children: Not on file   Years of education: Not on file   Highest education level: 6th grade  Occupational History   Not on file  Tobacco Use   Smoking status: Some Days    Types: Cigarettes   Smokeless tobacco: Never  Vaping Use   Vaping status: Never Used  Substance and Sexual Activity   Alcohol use: Yes    Comment: 12+ beverages    Drug use: Yes    Types: Marijuana    Comment: as a teenager   Sexual activity: Yes  Other Topics Concern   Not on file  Social History Narrative   Not on file   Social Drivers of Health   Tobacco Use: High Risk (02/05/2024)  Patient History    Smoking Tobacco Use: Some Days    Smokeless Tobacco Use: Never    Passive Exposure: Not on file  Financial Resource Strain: Low Risk (02/16/2024)   Overall Financial Resource Strain (CARDIA)    Difficulty of Paying Living Expenses: Not hard at all  Food Insecurity: No Food Insecurity (02/16/2024)   Epic    Worried About Programme Researcher, Broadcasting/film[employer]video in the Last Year: Never true    Ran Out of Food in the Last Year: Never true  Transportation Needs: No Transportation Needs (02/16/2024)   Epic    Lack of Transportation (Medical): No    Lack of Transportation (Non-Medical): No  Physical Activity: Sufficiently Active (02/16/2024)   Exercise Vital Sign    Days of Exercise per Week: 7 days    Minutes of Exercise per Session: 30 min  Stress: No Stress Concern Present (02/16/2024)   [employer] of Occupational Health - Occupational Stress Questionnaire    Feeling of Stress: Not at all  Social Connections: Socially Integrated  (02/16/2024)   Social Connection and Isolation Panel    Frequency of Communication with Friends and Family: More than three times a week    Frequency of Social Gatherings with Friends and Family: More than three times a week    Attends Religious Services: 1 to 4 times per year    Active Member of Clubs or Organizations: Yes    Attends Banker Meetings: 1 to 4 times per year    Marital Status: Married  Catering Manager Violence: Not on file  Depression (PHQ2-9): Low Risk (11/06/2023)   Depression (PHQ2-9)    PHQ-2 Score: 0  Alcohol Screen: Medium Risk (02/16/2024)   Alcohol Screen    Last Alcohol Screening Score (AUDIT): 14  Housing: Unknown (02/16/2024)   Epic    Unable to Pay for Housing in the Last Year: No    Number of Times Moved in the Last Year: Not on file    Homeless in the Last Year: No  Utilities: Not on file  Health Literacy: Not on file     Constitutional: Denies fever, malaise, fatigue, headache or abrupt weight changes.  HEENT: Denies eye pain, eye redness, ear pain, ringing in the ears, wax buildup, runny nose, nasal congestion, bloody nose, or sore throat. Respiratory: Denies difficulty breathing, shortness of breath, cough or sputum production.   Cardiovascular: Denies chest pain, chest tightness, palpitations or swelling in the hands or feet.  Gastrointestinal: Denies abdominal pain, bloating, constipation, diarrhea or blood in the stool.  GU: Patient reports erectile dysfunction.  Denies urgency, frequency, pain with urination, burning sensation, blood in urine, odor or discharge. Musculoskeletal: Patient reports bilateral knee pain.  Denies decrease in range of motion, difficulty with gait, muscle pain or joint swelling.  Skin: Denies redness, rashes, lesions or ulcercations.  Neurological: Denies dizziness, difficulty with memory, difficulty with speech or problems with balance and coordination.  Psych: Denies anxiety, depression, SI/HI.  No other  specific complaints in a complete review of systems (except as listed in HPI above).  Objective:   Physical Exam  BP 120/82 (BP Location: Left Arm, Patient Position: Sitting, Cuff Size: Large)   Ht 5' 6 (1.676 m)   Wt 274 lb (124.3 kg)   BMI 44.22 kg/m     Wt Readings from Last 3 Encounters:  02/05/24 267 lb (121.1 kg)  11/19/23 275 lb (124.7 kg)  11/06/23 275 lb 12.8 oz (125.1 kg)    General: Appears his  stated age, obese, in NAD. Skin: Warm, dry and intact. No ulcerations noted. HEENT: Head: normal shape and size; Eyes: sclera white, no icterus, conjunctiva pink, PERRLA and EOMs intact;  Cardiovascular: Normal rate and rhythm. S1,S2 noted.  No murmur, rubs or gallops noted. No JVD or BLE edema. No carotid bruits noted. Pulmonary/Chest: Normal effort and positive vesicular breath sounds. No respiratory distress. No wheezes, rales or ronchi noted.  Musculoskeletal: Joint lodgment noted of bilateral knees without effusions.  No difficulty with gait.  Neurological: Alert and oriented. Coordination normal.     BMET    Component Value Date/Time   NA 136 11/06/2023 1036   NA 140 07/25/2011 0817   K 4.3 11/06/2023 1036   K 4.0 07/25/2011 0817   CL 103 11/06/2023 1036   CL 107 07/25/2011 0817   CO2 27 11/06/2023 1036   CO2 26 07/25/2011 0817   GLUCOSE 107 (H) 11/06/2023 1036   GLUCOSE 103 (H) 07/25/2011 0817   BUN 15 11/06/2023 1036   BUN 11 07/25/2011 0817   CREATININE 0.55 (L) 11/06/2023 1036   CALCIUM  9.0 11/06/2023 1036   CALCIUM  8.4 (L) 07/25/2011 0817   GFRNONAA 114 11/23/2019 0940   GFRAA 132 11/23/2019 0940    Lipid Panel     Component Value Date/Time   CHOL 229 (H) 11/06/2023 1036   TRIG 187 (H) 11/06/2023 1036   HDL 33 (L) 11/06/2023 1036   CHOLHDL 6.9 (H) 11/06/2023 1036   VLDL 33 (H) 10/03/2016 0921   LDLCALC 162 (H) 11/06/2023 1036    CBC    Component Value Date/Time   WBC 5.2 07/24/2023 0840   RBC 4.89 07/24/2023 0840   HGB 14.6 07/24/2023  0840   HCT 45.3 07/24/2023 0840   PLT 261 07/24/2023 0840   MCV 92.6 07/24/2023 0840   MCH 29.9 07/24/2023 0840   MCHC 32.2 07/24/2023 0840   RDW 14.0 07/24/2023 0840   LYMPHSABS 2,083 11/23/2019 0940   MONOABS 230 10/03/2016 0921   EOSABS 151 11/23/2019 0940   BASOSABS 62 11/23/2019 0940    Hgb A1C Lab Results  Component Value Date   HGBA1C 7.8 (H) 11/06/2023           Assessment & Plan:     RTC in 5 months for your annual exam Angeline Laura, NP  "

## 2024-02-19 NOTE — Assessment & Plan Note (Signed)
 Complicated by morbid obesity C-Met and lipid profile today Encouraged him to consume a low-fat diet Continue atorvastatin  20 mg daily, will adjust if needed based on labs to obtain LDL <70

## 2024-02-20 ENCOUNTER — Telehealth: Payer: Self-pay

## 2024-02-20 ENCOUNTER — Other Ambulatory Visit (HOSPITAL_COMMUNITY): Payer: Self-pay

## 2024-02-20 ENCOUNTER — Ambulatory Visit: Payer: Self-pay | Admitting: Internal Medicine

## 2024-02-20 LAB — COMPREHENSIVE METABOLIC PANEL WITH GFR
AG Ratio: 1.6 (calc) (ref 1.0–2.5)
ALT: 28 U/L (ref 9–46)
AST: 20 U/L (ref 10–35)
Albumin: 4.4 g/dL (ref 3.6–5.1)
Alkaline phosphatase (APISO): 92 U/L (ref 35–144)
BUN/Creatinine Ratio: 19 (calc) (ref 6–22)
BUN: 11 mg/dL (ref 7–25)
CO2: 28 mmol/L (ref 20–32)
Calcium: 9.1 mg/dL (ref 8.6–10.3)
Chloride: 103 mmol/L (ref 98–110)
Creat: 0.59 mg/dL — ABNORMAL LOW (ref 0.70–1.30)
Globulin: 2.8 g/dL (ref 1.9–3.7)
Glucose, Bld: 140 mg/dL — ABNORMAL HIGH (ref 65–99)
Potassium: 4.7 mmol/L (ref 3.5–5.3)
Sodium: 138 mmol/L (ref 135–146)
Total Bilirubin: 0.9 mg/dL (ref 0.2–1.2)
Total Protein: 7.2 g/dL (ref 6.1–8.1)
eGFR: 116 mL/min/1.73m2

## 2024-02-20 LAB — HEMOGLOBIN A1C
Hgb A1c MFr Bld: 8 % — ABNORMAL HIGH
Mean Plasma Glucose: 183 mg/dL
eAG (mmol/L): 10.1 mmol/L

## 2024-02-20 LAB — LIPID PANEL
Cholesterol: 161 mg/dL
HDL: 29 mg/dL — ABNORMAL LOW
LDL Cholesterol (Calc): 105 mg/dL — ABNORMAL HIGH
Non-HDL Cholesterol (Calc): 132 mg/dL — ABNORMAL HIGH
Total CHOL/HDL Ratio: 5.6 (calc) — ABNORMAL HIGH
Triglycerides: 152 mg/dL — ABNORMAL HIGH

## 2024-02-20 MED ORDER — REPATHA SURECLICK 140 MG/ML ~~LOC~~ SOAJ: 140.0000 mg | SUBCUTANEOUS | 0 refills | Status: AC

## 2024-02-20 NOTE — Telephone Encounter (Signed)
 Pharmacy Patient Advocate Encounter   Received notification from Physician's Office that prior authorization for Mounjaro  2.5 mg/0.5 ml is required/requested.   Insurance verification completed.   The patient is insured through [facility].   Per test claim: PA required; PA submitted to above mentioned insurance via Latent Key/confirmation #/EOC [id number] Status is pending

## 2024-02-20 NOTE — Telephone Encounter (Signed)
 Can we check on PA for Mounjaro ? Thanks!

## 2024-02-24 ENCOUNTER — Other Ambulatory Visit: Payer: Self-pay

## 2024-02-24 MED ORDER — TIRZEPATIDE 2.5 MG/0.5ML ~~LOC~~ SOAJ
2.5000 mg | SUBCUTANEOUS | 0 refills | Status: AC
  Filled 2024-02-24: qty 2, 28d supply, fill #0

## 2024-02-26 ENCOUNTER — Other Ambulatory Visit (HOSPITAL_COMMUNITY): Payer: Self-pay

## 2024-02-26 ENCOUNTER — Telehealth: Payer: Self-pay

## 2024-02-26 NOTE — Telephone Encounter (Signed)
 Copied from CRM #[id number]. Topic: Clinical - Medication Question >> Feb 26, 2024 11:55 AM Lonell PEDLAR wrote: Reason for CRM: Patient called, requesting clarification on how to inject tirzipitide. C/b: [phone]

## 2024-02-26 NOTE — Telephone Encounter (Signed)
 Pharmacy Patient Advocate Encounter  Received notification from [employer] that Prior Authorization for  Mounjaro  2.5 mg/0.5 ml  has been APPROVED from 02/05/2097 to 02/05/2097. Ran test claim, Copay is $25. This test claim was processed through [facility]- copay amounts may vary at other pharmacies due to pharmacy/plan contracts, or as the patient moves through the different stages of their insurance plan.   PA #/Case ID/Reference #: [id number]

## 2024-02-27 NOTE — Telephone Encounter (Signed)
 Around the umbilicus or the thigh.

## 2024-02-27 NOTE — Telephone Encounter (Signed)
 Spoke with patient, verbal understanding of locations for injections.

## 2024-02-28 ENCOUNTER — Other Ambulatory Visit: Payer: Self-pay

## 2024-05-20 ENCOUNTER — Ambulatory Visit: Admitting: Internal Medicine

## 2024-07-29 ENCOUNTER — Encounter: Admitting: Internal Medicine
